# Patient Record
Sex: Female | Born: 1965 | Race: Black or African American | Hispanic: No | Smoking: Never smoker
Health system: Southern US, Community
[De-identification: ages and names within clinical notes are randomized; demographics above are authoritative.]

## PROBLEM LIST (undated history)

## (undated) DIAGNOSIS — F32A Depression, unspecified: Secondary | ICD-10-CM

## (undated) DIAGNOSIS — F329 Major depressive disorder, single episode, unspecified: Secondary | ICD-10-CM

## (undated) DIAGNOSIS — F4310 Post-traumatic stress disorder, unspecified: Secondary | ICD-10-CM

## (undated) DIAGNOSIS — E119 Type 2 diabetes mellitus without complications: Secondary | ICD-10-CM

## (undated) DIAGNOSIS — K219 Gastro-esophageal reflux disease without esophagitis: Secondary | ICD-10-CM

## (undated) DIAGNOSIS — J40 Bronchitis, not specified as acute or chronic: Secondary | ICD-10-CM

## (undated) DIAGNOSIS — H409 Unspecified glaucoma: Secondary | ICD-10-CM

## (undated) DIAGNOSIS — F419 Anxiety disorder, unspecified: Secondary | ICD-10-CM

## (undated) DIAGNOSIS — I10 Essential (primary) hypertension: Secondary | ICD-10-CM

## (undated) DIAGNOSIS — Z9109 Other allergy status, other than to drugs and biological substances: Secondary | ICD-10-CM

## (undated) HISTORY — DX: Type 2 diabetes mellitus without complications: E11.9

## (undated) HISTORY — DX: Anxiety disorder, unspecified: F41.9

## (undated) HISTORY — DX: Major depressive disorder, single episode, unspecified: F32.9

## (undated) HISTORY — DX: Unspecified glaucoma: H40.9

## (undated) HISTORY — PX: ABDOMINAL HYSTERECTOMY: SHX81

## (undated) HISTORY — DX: Bronchitis, not specified as acute or chronic: J40

## (undated) HISTORY — DX: Depression, unspecified: F32.A

## (undated) HISTORY — DX: Gastro-esophageal reflux disease without esophagitis: K21.9

## (undated) HISTORY — DX: Other allergy status, other than to drugs and biological substances: Z91.09

---

## 1998-08-14 ENCOUNTER — Other Ambulatory Visit: Admission: RE | Admit: 1998-08-14 | Discharge: 1998-08-14 | Payer: Self-pay | Admitting: Family Medicine

## 1999-05-03 ENCOUNTER — Emergency Department (HOSPITAL_COMMUNITY): Admission: EM | Admit: 1999-05-03 | Discharge: 1999-05-03 | Payer: Self-pay | Admitting: Emergency Medicine

## 1999-05-04 ENCOUNTER — Encounter: Payer: Self-pay | Admitting: Emergency Medicine

## 1999-11-07 ENCOUNTER — Encounter: Payer: Self-pay | Admitting: Family Medicine

## 1999-11-07 ENCOUNTER — Inpatient Hospital Stay (HOSPITAL_COMMUNITY): Admission: AD | Admit: 1999-11-07 | Discharge: 1999-11-07 | Payer: Self-pay | Admitting: Family Medicine

## 1999-12-27 ENCOUNTER — Emergency Department (HOSPITAL_COMMUNITY): Admission: EM | Admit: 1999-12-27 | Discharge: 1999-12-27 | Payer: Self-pay | Admitting: Emergency Medicine

## 2000-03-26 ENCOUNTER — Encounter: Payer: Self-pay | Admitting: *Deleted

## 2000-03-26 ENCOUNTER — Inpatient Hospital Stay (HOSPITAL_COMMUNITY): Admission: AD | Admit: 2000-03-26 | Discharge: 2000-03-26 | Payer: Self-pay | Admitting: *Deleted

## 2000-08-10 ENCOUNTER — Ambulatory Visit (HOSPITAL_COMMUNITY): Admission: RE | Admit: 2000-08-10 | Discharge: 2000-08-10 | Payer: Self-pay | Admitting: Family Medicine

## 2000-08-10 ENCOUNTER — Encounter: Payer: Self-pay | Admitting: Family Medicine

## 2000-11-09 ENCOUNTER — Other Ambulatory Visit: Admission: RE | Admit: 2000-11-09 | Discharge: 2000-11-09 | Payer: Self-pay | Admitting: Family Medicine

## 2000-11-21 ENCOUNTER — Encounter: Payer: Self-pay | Admitting: Family Medicine

## 2000-11-21 ENCOUNTER — Ambulatory Visit (HOSPITAL_COMMUNITY): Admission: RE | Admit: 2000-11-21 | Discharge: 2000-11-21 | Payer: Self-pay | Admitting: Family Medicine

## 2001-09-12 ENCOUNTER — Ambulatory Visit (HOSPITAL_COMMUNITY): Admission: RE | Admit: 2001-09-12 | Discharge: 2001-09-12 | Payer: Self-pay | Admitting: Family Medicine

## 2003-02-03 ENCOUNTER — Emergency Department (HOSPITAL_COMMUNITY): Admission: EM | Admit: 2003-02-03 | Discharge: 2003-02-03 | Payer: Self-pay | Admitting: Emergency Medicine

## 2003-09-12 ENCOUNTER — Encounter: Admission: RE | Admit: 2003-09-12 | Discharge: 2003-09-12 | Payer: Self-pay | Admitting: Internal Medicine

## 2003-09-13 ENCOUNTER — Encounter: Admission: RE | Admit: 2003-09-13 | Discharge: 2003-09-13 | Payer: Self-pay | Admitting: Internal Medicine

## 2004-09-21 ENCOUNTER — Encounter: Admission: RE | Admit: 2004-09-21 | Discharge: 2004-09-21 | Payer: Self-pay | Admitting: Internal Medicine

## 2004-09-23 ENCOUNTER — Ambulatory Visit (HOSPITAL_COMMUNITY): Admission: RE | Admit: 2004-09-23 | Discharge: 2004-09-23 | Payer: Self-pay | Admitting: Internal Medicine

## 2004-11-18 ENCOUNTER — Ambulatory Visit (HOSPITAL_COMMUNITY): Admission: RE | Admit: 2004-11-18 | Discharge: 2004-11-18 | Payer: Self-pay | Admitting: Obstetrics and Gynecology

## 2004-12-29 ENCOUNTER — Encounter (INDEPENDENT_AMBULATORY_CARE_PROVIDER_SITE_OTHER): Payer: Self-pay | Admitting: *Deleted

## 2004-12-29 ENCOUNTER — Observation Stay (HOSPITAL_COMMUNITY): Admission: RE | Admit: 2004-12-29 | Discharge: 2004-12-30 | Payer: Self-pay | Admitting: Obstetrics and Gynecology

## 2005-01-30 ENCOUNTER — Inpatient Hospital Stay (HOSPITAL_COMMUNITY): Admission: AD | Admit: 2005-01-30 | Discharge: 2005-01-30 | Payer: Self-pay | Admitting: Obstetrics and Gynecology

## 2005-07-02 ENCOUNTER — Emergency Department (HOSPITAL_COMMUNITY): Admission: EM | Admit: 2005-07-02 | Discharge: 2005-07-02 | Payer: Self-pay | Admitting: Family Medicine

## 2005-09-23 ENCOUNTER — Ambulatory Visit (HOSPITAL_COMMUNITY): Admission: RE | Admit: 2005-09-23 | Discharge: 2005-09-23 | Payer: Self-pay | Admitting: Internal Medicine

## 2005-11-18 ENCOUNTER — Emergency Department (HOSPITAL_COMMUNITY): Admission: EM | Admit: 2005-11-18 | Discharge: 2005-11-18 | Payer: Self-pay | Admitting: Emergency Medicine

## 2006-10-04 ENCOUNTER — Ambulatory Visit (HOSPITAL_COMMUNITY): Admission: RE | Admit: 2006-10-04 | Discharge: 2006-10-04 | Payer: Self-pay | Admitting: Internal Medicine

## 2008-02-15 ENCOUNTER — Ambulatory Visit (HOSPITAL_COMMUNITY): Admission: RE | Admit: 2008-02-15 | Discharge: 2008-02-15 | Payer: Self-pay | Admitting: Internal Medicine

## 2008-08-07 ENCOUNTER — Encounter: Admission: RE | Admit: 2008-08-07 | Discharge: 2008-09-12 | Payer: Self-pay | Admitting: Internal Medicine

## 2009-02-21 ENCOUNTER — Ambulatory Visit (HOSPITAL_COMMUNITY): Admission: RE | Admit: 2009-02-21 | Discharge: 2009-02-21 | Payer: Self-pay | Admitting: Internal Medicine

## 2009-02-28 ENCOUNTER — Encounter: Admission: RE | Admit: 2009-02-28 | Discharge: 2009-02-28 | Payer: Self-pay | Admitting: Internal Medicine

## 2010-06-07 ENCOUNTER — Encounter: Payer: Self-pay | Admitting: Internal Medicine

## 2010-06-07 ENCOUNTER — Encounter: Payer: Self-pay | Admitting: Obstetrics and Gynecology

## 2010-10-02 NOTE — H&P (Signed)
NAMENASTASSIA, BAZALDUA NO.:  [id number]   MEDICAL RECORD NO.:  [id number]          PATIENT TYPE:  AMB   LOCATION:  SDC                           FACILITY:  [facility]   PHYSICIAN:  Janine Limbo, M.D.DATE OF BIRTH:  1965/11/22   DATE OF ADMISSION:  12/29/2004  DATE OF DISCHARGE:                                HISTORY & PHYSICAL   HISTORY OF PRESENT ILLNESS:  Ms. Carmen Perkins is a 45 year old female, para 2-1-0-  3, who presents with a fibroid uterus.  She complains of dysmenorrhea and  menorrhagia.  The patient has a past history of pelvic inflammatory disease.  The patient has longstanding pelvic pain and discomfort.  She wants to  proceed at this point with definitive therapy.  Her endometrial biopsy was  within normal limits.  Gonorrhea and Chlamydia tests were normal.  Her most  recent Pap smear was in April 2006, and it was within normal limits.   OBSTETRICAL HISTORY:  1.  The patient has had two term vaginal deliveries.  2.  One preterm vaginal delivery.  3.  She has had a tubal ligation.  4.  The patient was noted to have pelvic adhesions at the time of her tubal      ligation.   PAST MEDICAL HISTORY:  1.  The patient has hypertension, and she currently takes      hydrochlorothiazide and felodipine.  2.  In 2002, the patient was noted to have pain at her umbilical incision      and she had a repair of umbilical scarring.  3.  The patient also has a past history of depression.  4.  Gastroesophageal reflux disease.  5.  She has allergic rhinitis that is well controlled.   DRUG ALLERGIES:  No known drug allergies.   REVIEW OF SYSTEMS:  Please see history of present illness.   FAMILY HISTORY:  The patient's mother and father have hypertension.  She has  one child with asthma.   PHYSICAL EXAMINATION:  VITAL SIGNS:  Weight is 157 pounds, height is 5 feet  4 inches.  HEENT:  Within normal limits.  CHEST:  Clear.  HEART:  Regular rate and rhythm.  BREASTS:   Without masses.  ABDOMEN:  Nontender.  NEUROLOGIC:  Grossly normal.  EXTREMITIES:  Grossly normal.  PELVIC:  External genitalia is normal.  Vagina is normal.  Cervix is  nontender and no lesions are appreciated.  The uterus is eight weeks size  and irregular by exam.  No adnexal masses are appreciated.   ASSESSMENT:  1.  Fibroid uterus.  2.  Menorrhagia.  3.  Dysmenorrhea.  4.  Pelvic pain.  5.  History of pelvic inflammatory disease.   PLAN:  The patient will undergo a laparoscopy-assisted vaginal hysterectomy.  She understands the indications for her surgical procedure, and she accepts  the risk of, but not limited to, anesthetic complications, bleeding,  infections, and possible damage to the surrounding organs.       AVS/MEDQ  D:  12/26/2004  T:  12/26/2004  Job:  [id number]   cc:   Fleet Contras, M.D.  [address]  [place] [address]  Fax: [phone]  Email: [email or web address]

## 2010-10-02 NOTE — Op Note (Signed)
NAMEBELLANIE, Carmen Perkins   MEDICAL RECORD NO.:  [id number]          PATIENT TYPE:  OBV   LOCATION:  9399                          FACILITY:  [facility]   PHYSICIAN:  Carmen Perkins, M.D.DATE OF BIRTH:  12-08-1965   DATE OF PROCEDURE:  12/29/2004  DATE OF DISCHARGE:                                 OPERATIVE REPORT   PREOPERATIVE DIAGNOSES:  1.  Fibroid uterus.  2.  Menorrhagia.  3.  Dysmenorrhea.  4.  Pelvic pain.  5.  History of pelvic inflammatory disease.   POSTOPERATIVE DIAGNOSES:  1.  Menorrhagia.  2.  Dysmenorrhea.  3.  Pelvic pain.  4.  History of pelvic inflammatory disease.  5.  Possible adenomyosis.   PROCEDURE:  Laparoscopy-assisted vaginal hysterectomy.   SURGEON:  Carmen Perkins, M.D.   FIRST ASSISTANT:   ANESTHETIC:  General.   DISPOSITION:  Carmen Perkins is a 45 year old female, para 2-1-0-3, who  presents with the above-mentioned diagnosis.  She understands the  indications for her surgical procedure and she accepts the risk, but not  limited to, anesthetic complications, bleeding, infections, and possible  damage to the surrounding organs.   FINDINGS:  The uterus was approximately eight weeks' size and it had a firm,  boggy fundus, and this was thought to be consistent with adenomyosis.  The  fallopian tubes and the ovaries appeared normal except for defects in the  tubes from the prior tubal ligation.  There was no evidence of adhesions at  this point even though adhesions had been seen previously on laparoscopy.  The bowel and the upper abdomen appeared normal.   PROCEDURE:  The patient was taken to the operating room, where a general  anesthetic was given.  The patient's abdomen, perineum, and vagina were  prepped with multiple layers of Betadine.  A Foley catheter was placed in  the bladder.  Examination under anesthesia was performed.  A Hulka tenaculum  was placed inside the uterus.  The patient was then  sterilely draped.  The  subumbilical area was injected with 0.5% Marcaine with epinephrine.  A  subumbilical incision was made removing the old scar from the prior  laparoscopies.  The incision was extended through the subcutaneous tissue,  the fascia, and the anterior peritoneum.  The Hasson cannula was sutured  into place using 2-0 Vicryl.  The pelvis was carefully inspected after a  pneumoperitoneum was obtained.  We noted that the posterior cul-de-sac was  clear and the adnexa were not adhered to the pelvic sidewall.  We felt that  we could safely proceed with vaginal hysterectomy.  The patient was then  placed in a more lithotomy position.  The cervix was injected with a diluted  solution of Pitressin and saline.  A circumferential incision was made  around the cervix and the vaginal mucosa was advanced anteriorly and  posteriorly.  The posterior cul-de-sac was sharply entered.  Alternating  from right to left, the uterosacral ligaments, paracervical tissues,  parametrial tissues, and uterine arteries were clamped, cut, sutured, and  tied securely.  The uterus was inverted through the posterior  colpotomy.  The upper pedicles were secured using Heaney clamps.  The uterus was  transected from our operative field.  The upper pedicles were then sutured  and then free tied.  Hemostasis was noted to be adequate bilaterally.  The  sutures attached to the uterosacral ligaments were brought out through the  vaginal angles and tied securely.  A McCall culdoplasty suture was placed in  the posterior cul-de-sac incorporating the uterosacral ligaments bilaterally  and the posterior peritoneum.  A final check was made for hemostasis and  hemostasis was noted to be adequate after a figure-of-eight suture was  placed.  All instruments were then removed except for the weighted speculum.  The vaginal cuff was then closed using figure-of-eight sutures incorporating  the anterior vaginal mucosa, the  anterior peritoneum, the posterior  peritoneum, and then the posterior vaginal mucosa.  The suture was tied  securely.  The McCall culdoplasty suture was tied and the apex of the vagina  was noted to elevate into the midpelvis.  The operator then changed gloves  and put on a sterile drape.  A pneumoperitoneum was reestablished.  The  pelvis was carefully inspected and there was no evidence of bleeding.  The  pelvic structures appeared normal for what would be expected after a vaginal  hysterectomy.  All instruments were therefore removed.  The anterior  peritoneum and the fascia were closed using figure-of-eight sutures of 2-0  Vicryl.  The subcutaneous layer was closed using 2-0 Vicryl.  The skin was  reapproximated using a subcuticular suture of 3-0 Monocryl.  Sponge, needle,  and instrument counts were correct on two occasions.  The estimated blood  loss for the procedure was 100 mL.  The patient tolerated her procedure  well.  She was awakened from her anesthetic and taken to the recovery room  in stable condition.      Carmen Perkins, M.D.  Electronically Signed     AVS/MEDQ  D:  12/29/2004  T:  12/29/2004  Job:  [id number]   cc:   Fleet Contras, M.D.  [address]  [place] [address]  Fax: [phone]  Email: [email or web address]

## 2010-10-02 NOTE — Discharge Summary (Signed)
[name], [name] NO.:  [id number]   MEDICAL RECORD NO.:  [id number]          PATIENT TYPE:  OBV   LOCATION:  9316                          [facility]ACILITY:  [facility]   PHYSICIAN:  Janine Limbo, M.D.DATE OF BIRTH:  Aug 21, 1965   DATE OF ADMISSION:  12/29/2004  DATE OF DISCHARGE:  12/30/2004                                 DISCHARGE SUMMARY   ADMISSION DIAGNOSES:  1.  Fibroid uterus.  2.  Menorrhagia.  3.  Dysmenorrhea.  4.  Pelvic pain.  5.  History of pelvic inflammatory disease.   POSTOPERATIVE DIAGNOSES:  1.  Fibroid uterus.  2.  Menorrhagia.  3.  Dysmenorrhea.  4.  Pelvic pain.  5.  History of pelvic inflammatory disease.  6.  Anemia.   PROCEDURES THIS ADMISSION:  December 29, 2004 -  laparoscopy-assisted vaginal  hysterectomy.   HISTORY OF PRESENT ILLNESS:  Carmen Perkins is a 45 year old female, para 2-1-0-  3, who presents with dysmenorrhea and menorrhagia. The patient has a history  of pelvic inflammatory disease. The patient also complains of longstanding  pelvic pain and discomfort. She has decided to proceed with definitive  therapy. Please see her dictated history and physical exam for details.   ADMISSION PHYSICAL EXAMINATION:  The patient's uterus is 8 weeks size.   HOSPITAL COURSE:  On the day of admission the patient was taken to the  operating room where she had a laparoscopy-assisted vaginal hysterectomy.  Operative findings included a uterus that was upper limits normal size. No  adhesions were noted. The fallopian tubes and the ovaries appeared normal.  The patient tolerated her procedure well. Her postoperative hemoglobin was  11.4 (preoperative hemoglobin is 13.2). The patient remained afebrile  throughout her postoperative stay. She quickly tolerated a regular diet and  was discharged to home on postoperative day #1 doing well.   DISCHARGE MEDICATIONS:  1.  Vicodin one or two tablets every 4 hours as needed for pain.  2.  Ibuprofen  600 mg every 6 hours as needed for pain.  3.  Phenergan 25 mg one tablet every 6 hours as needed for nausea.   DISCHARGE INSTRUCTIONS:  The patient will return to see Dr. Stefano Gaul in 6  weeks for follow-up examination. She was given a copy of the postoperative  instruction sheet as prepared by the [facility] division of  [facility]. The patient will refrain from driving for 1  week, heavy lifting for 4 weeks, and intercourse for 6 weeks. She will call  for temperature greater than 100.4 or for other serious complications.   FINAL PATHOLOGY REPORT:  Chronic cervicitis with proliferative endometrium.  Leiomyomata noted within the uterine cavity.      Janine Limbo, M.D.  Electronically Signed     AVS/MEDQ  D:  01/29/2005  T:  01/29/2005  Job:  [id number]

## 2011-07-01 ENCOUNTER — Emergency Department (HOSPITAL_COMMUNITY)
Admission: EM | Admit: 2011-07-01 | Discharge: 2011-07-01 | Disposition: A | Discharge status: Home / Self Care | Payer: No Typology Code available for payment source | Attending: Emergency Medicine | Admitting: Emergency Medicine

## 2011-07-01 ENCOUNTER — Encounter (HOSPITAL_COMMUNITY): Payer: Self-pay | Admitting: Emergency Medicine

## 2011-07-01 ENCOUNTER — Emergency Department (HOSPITAL_COMMUNITY): Payer: No Typology Code available for payment source

## 2011-07-01 DIAGNOSIS — IMO0002 Reserved for concepts with insufficient information to code with codable children: Secondary | ICD-10-CM | POA: Insufficient documentation

## 2011-07-01 DIAGNOSIS — Y92410 Unspecified street and highway as the place of occurrence of the external cause: Secondary | ICD-10-CM | POA: Insufficient documentation

## 2011-07-01 DIAGNOSIS — S43409A Unspecified sprain of unspecified shoulder joint, initial encounter: Secondary | ICD-10-CM

## 2011-07-01 DIAGNOSIS — S39012A Strain of muscle, fascia and tendon of lower back, initial encounter: Secondary | ICD-10-CM

## 2011-07-01 DIAGNOSIS — S335XXA Sprain of ligaments of lumbar spine, initial encounter: Secondary | ICD-10-CM | POA: Insufficient documentation

## 2011-07-01 HISTORY — DX: Post-traumatic stress disorder, unspecified: F43.10

## 2011-07-01 HISTORY — DX: Essential (primary) hypertension: I10

## 2011-07-01 MED ORDER — HYDROCODONE-ACETAMINOPHEN 5-500 MG PO TABS: 1.0000 | ORAL_TABLET | Freq: Four times a day (QID) | ORAL | Status: DC | PRN

## 2011-07-01 MED ORDER — IBUPROFEN 800 MG PO TABS
800.0000 mg | ORAL_TABLET | Freq: Once | ORAL | Status: AC
  Administered 2011-07-01: 800 mg via ORAL
  Filled 2011-07-01: qty 1

## 2011-07-01 MED ORDER — CYCLOBENZAPRINE HCL 10 MG PO TABS: 10.0000 mg | ORAL_TABLET | Freq: Two times a day (BID) | ORAL | Status: AC | PRN
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
## 2011-07-01 NOTE — Discharge Instructions (Signed)
Back Pain, Adult Low back pain is very common. About 1 in 5 people have back pain.The cause of low back pain is rarely dangerous. The pain often gets better over time.About half of people with a sudden onset of back pain feel better in just 2 weeks. About 8 in 10 people feel better by 6 weeks.  CAUSES Some common causes of back pain include:  Strain of the muscles or ligaments supporting the spine.   Wear and tear (degeneration) of the spinal discs.   Arthritis.   Direct injury to the back.  DIAGNOSIS Most of the time, the direct cause of low back pain is not known.However, back pain can be treated effectively even when the exact cause of the pain is unknown.Answering your caregiver's questions about your overall health and symptoms is one of the most accurate ways to make sure the cause of your pain is not dangerous. If your caregiver needs more information, he or she may order lab work or imaging tests (X-rays or MRIs).However, even if imaging tests show changes in your back, this usually does not require surgery. HOME CARE INSTRUCTIONS For many people, back pain returns.Since low back pain is rarely dangerous, it is often a condition that people can learn to manageon their own.   Remain active. It is stressful on the back to sit or stand in one place. Do not sit, drive, or stand in one place for more than 30 minutes at a time. Take short walks on level surfaces as soon as pain allows.Try to increase the length of time you walk each day.   Do not stay in bed.Resting more than 1 or 2 days can delay your recovery.   Do not avoid exercise or work.Your body is made to move.It is not dangerous to be active, even though your back may hurt.Your back will likely heal faster if you return to being active before your pain is gone.   Pay attention to your body when you bend and lift. Many people have less discomfortwhen lifting if they bend their knees, keep the load close to their  bodies,and avoid twisting. Often, the most comfortable positions are those that put less stress on your recovering back.   Find a comfortable position to sleep. Use a firm mattress and lie on your side with your knees slightly bent. If you lie on your back, put a pillow under your knees.   Only take over-the-counter or prescription medicines as directed by your caregiver. Over-the-counter medicines to reduce pain and inflammation are often the most helpful.Your caregiver may prescribe muscle relaxant drugs.These medicines help dull your pain so you can more quickly return to your normal activities and healthy exercise.   Put ice on the injured area.   Put ice in a plastic bag.   Place a towel between your skin and the bag.   Leave the ice on for 15 to 20 minutes, 3 to 4 times a day for the first 2 to 3 days. After that, ice and heat may be alternated to reduce pain and spasms.   Ask your caregiver about trying back exercises and gentle massage. This may be of some benefit.   Avoid feeling anxious or stressed.Stress increases muscle tension and can worsen back pain.It is important to recognize when you are anxious or stressed and learn ways to manage it.Exercise is a great option.  SEEK MEDICAL CARE IF:  You have pain that is not relieved with rest or medicine.   You have   pain that does not improve in 1 week.   You have new symptoms.   You are generally not feeling well.  SEEK IMMEDIATE MEDICAL CARE IF:   You have pain that radiates from your back into your legs.   You develop new bowel or bladder control problems.   You have unusual weakness or numbness in your arms or legs.   You develop nausea or vomiting.   You develop abdominal pain.   You feel faint.  Document Released: 05/03/2005 Document Revised: 01/13/2011 Document Reviewed: 09/21/2010 [employer] Patient Information 2012 [employer]. 

## 2011-07-03 ENCOUNTER — Emergency Department (HOSPITAL_COMMUNITY)
Admission: EM | Admit: 2011-07-03 | Discharge: 2011-07-03 | Disposition: A | Discharge status: Home / Self Care | Payer: No Typology Code available for payment source | Attending: Emergency Medicine | Admitting: Emergency Medicine

## 2011-07-03 ENCOUNTER — Encounter (HOSPITAL_COMMUNITY): Payer: Self-pay | Admitting: *Deleted

## 2011-07-03 ENCOUNTER — Emergency Department (HOSPITAL_COMMUNITY): Payer: No Typology Code available for payment source

## 2011-07-03 DIAGNOSIS — M25519 Pain in unspecified shoulder: Secondary | ICD-10-CM | POA: Insufficient documentation

## 2011-07-03 DIAGNOSIS — Z79899 Other long term (current) drug therapy: Secondary | ICD-10-CM | POA: Insufficient documentation

## 2011-07-03 DIAGNOSIS — M549 Dorsalgia, unspecified: Secondary | ICD-10-CM | POA: Insufficient documentation

## 2011-07-03 DIAGNOSIS — M79609 Pain in unspecified limb: Secondary | ICD-10-CM | POA: Insufficient documentation

## 2011-07-03 DIAGNOSIS — M542 Cervicalgia: Secondary | ICD-10-CM | POA: Insufficient documentation

## 2011-07-03 DIAGNOSIS — F0781 Postconcussional syndrome: Secondary | ICD-10-CM

## 2011-07-03 DIAGNOSIS — R51 Headache: Secondary | ICD-10-CM | POA: Insufficient documentation

## 2011-07-03 DIAGNOSIS — I10 Essential (primary) hypertension: Secondary | ICD-10-CM | POA: Insufficient documentation

## 2011-07-03 DIAGNOSIS — IMO0001 Reserved for inherently not codable concepts without codable children: Secondary | ICD-10-CM | POA: Insufficient documentation
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
## 2011-07-03 NOTE — Discharge Instructions (Signed)
Be sure to read and understand instructions below prior to leaving the hospital. If your symptoms persist without any improvement in 1 week it is reccommended that you follow up with orthopedics listed above.  Common mechanisms of injury include:   Direct hit (trauma) to the shoulder.  Aging, erosion of the tendon with normal use.  Bony bump on shoulder (acromial spur).  Stress from sudden increase in duration, frequency, or intensity of training  RISK INCREASES WITH:  Contact sports (football, wrestling, boxing).  Throwing sports (baseball, tennis, volleyball).  Weightlifting and bodybuilding.  Heavy labor.  Previous injury to the rotator cuff, including impingement.  Poor shoulder strength and flexibility.  Failure to warm up properly before activity.  Inadequate protective equipment.  Old age.  Bony bump on shoulder (acromial spur).   RELATED COMPLICATIONS  Shoulder stiffness, frozen shoulder, or loss of motion.  Rotator cuff tendon tear.  Recurring symptoms, especially if activity is resumed too soon, with overuse, with a direct blow, or when using poor technique.   TREATMENT  Treatment first involves the use of ice and medicine, to reduce pain and inflammation. The use of strengthening and stretching exercises may help reduce pain with activity. These exercises may be performed at home or with a therapist.  If non-surgical treatment is unsuccessful after more than 6 months, surgery may be advised. MEDICATION  If pain medicine is needed, nonsteroidal anti-inflammatory medicines (Motrin and ibuprofen), or other minor pain relievers (acetaminophen) Prescription pain relievers may be given, if your caregiver thinks they are needed. Use only as directed and only as much as you need  ACTIVITY       - It is reccommended to perform range of motion activity as tolerated to prevent shoulder stiffness.  HEAT AND COLD  Cold treatment (icing) should be applied for 10 to 15 minutes every 2 to  3 hours for inflammation and pain, and immediately after activity that aggravates your symptoms. Use ice packs or an ice massage.  Heat treatment may be used before performing stretching and strengthening activities prescribed by your caregiver, physical therapist, or athletic trainer. Use a heat pack or a warm water soak.  SEEK IMMEDIATE MEDICAL CARE IF:  Your arm, hand, or fingers are numb or tingling.  Your arm, hand, or fingers are swollen, painful, or turn white or blue.  You develop chest pain or shortness of breath.   Post-Concussion Syndrome  Post-concussion syndrome describes the symptoms that can occur after a head injury. These symptoms can last from weeks to months.  CAUSES  It is not clear why some head injuries cause post-concussion syndrome. It can occur whether your head injury was mild or severe and whether you were wearing head protection or not.  SYMPTOMS  Memory difficulties.  Dizziness.  Headaches.  Double vision or blurry vision.  Sensitivity to light.  Hearing difficulties.  Depression.  Tiredness.  Weakness.  Difficulty with concentration.  Difficulty sleeping or staying asleep.  Vomiting.  DIAGNOSIS  There is no test to determine whether you have post-concussion syndrome. Your caregiver may order an imaging scan of your brain, such as a CT scan, to check for other problems that may be causing your symptoms (such as severe injury inside your skull).  TREATMENT  Usually, these problems disappear over time without medical care. Your caregiver may prescribe medicine to help ease your symptoms. It is important to follow up with a neurologist to evaluate your recovery and address any lingering symptoms or issues.  HOME CARE  INSTRUCTIONS  Only take over-the-counter or prescription medicines for pain, discomfort, or fever as directed by your caregiver. Do not take aspirin. Aspirin can slow blood clotting.  Sleep with your head slightly elevated to help with headaches.    Avoid any situation where there is potential for another head injury (football, hockey, martial arts, horseback riding). Your condition will get worse every time you experience a concussion. You should avoid these activities until you are evaluated by the appropriate follow-up caregivers.  Keep all follow-up appointments as directed by your caregiver.  SEEK IMMEDIATE MEDICAL CARE IF:  You develop confusion or unusual drowsiness.  You cannot wake the injured person.  You develop nausea or persistent, forceful vomiting.  You feel like you are moving when you are not (vertigo).  You notice the injured person's eyes moving rapidly back and forth. This may be a sign of vertigo.  You have convulsions or faint.  You have severe, persistent headaches that are not relieved by medicine.  You cannot use your arms or legs normally.  Your pupils change size.  You have clear or bloody discharge from the nose or ears.  Your problems are getting worse, not better.

## 2011-07-04 NOTE — ED Provider Notes (Signed)
Medical screening examination/treatment/procedure(s) were performed by non-physician practitioner and as supervising physician I was immediately available for consultation/collaboration.  Nicholes Stairs, MD 07/04/11 0010

## 2011-11-10 ENCOUNTER — Other Ambulatory Visit: Payer: Self-pay | Admitting: Internal Medicine

## 2011-11-10 DIAGNOSIS — N63 Unspecified lump in unspecified breast: Secondary | ICD-10-CM

## 2011-11-15 ENCOUNTER — Other Ambulatory Visit: Payer: No Typology Code available for payment source

## 2013-05-09 ENCOUNTER — Other Ambulatory Visit: Payer: Self-pay | Admitting: Emergency Medicine

## 2014-01-09 ENCOUNTER — Encounter: Payer: Self-pay | Admitting: Gastroenterology

## 2014-03-05 ENCOUNTER — Encounter: Payer: Self-pay | Admitting: Gastroenterology

## 2014-07-09 DIAGNOSIS — I10 Essential (primary) hypertension: Secondary | ICD-10-CM | POA: Diagnosis not present

## 2014-07-09 DIAGNOSIS — R7301 Impaired fasting glucose: Secondary | ICD-10-CM | POA: Diagnosis not present

## 2014-07-09 DIAGNOSIS — J302 Other seasonal allergic rhinitis: Secondary | ICD-10-CM | POA: Diagnosis not present

## 2014-07-09 DIAGNOSIS — J449 Chronic obstructive pulmonary disease, unspecified: Secondary | ICD-10-CM | POA: Diagnosis not present

## 2014-07-09 DIAGNOSIS — F4312 Post-traumatic stress disorder, chronic: Secondary | ICD-10-CM | POA: Diagnosis not present

## 2014-07-16 DIAGNOSIS — R7301 Impaired fasting glucose: Secondary | ICD-10-CM | POA: Diagnosis not present

## 2014-07-16 DIAGNOSIS — I10 Essential (primary) hypertension: Secondary | ICD-10-CM | POA: Diagnosis not present

## 2014-07-16 DIAGNOSIS — J302 Other seasonal allergic rhinitis: Secondary | ICD-10-CM | POA: Diagnosis not present

## 2014-07-16 DIAGNOSIS — J019 Acute sinusitis, unspecified: Secondary | ICD-10-CM | POA: Diagnosis not present

## 2014-08-13 DIAGNOSIS — Z6832 Body mass index (BMI) 32.0-32.9, adult: Secondary | ICD-10-CM | POA: Diagnosis not present

## 2014-08-13 DIAGNOSIS — Z13220 Encounter for screening for lipoid disorders: Secondary | ICD-10-CM | POA: Diagnosis not present

## 2014-08-13 DIAGNOSIS — I10 Essential (primary) hypertension: Secondary | ICD-10-CM | POA: Diagnosis not present

## 2014-08-13 DIAGNOSIS — R7301 Impaired fasting glucose: Secondary | ICD-10-CM | POA: Diagnosis not present

## 2014-08-13 DIAGNOSIS — E669 Obesity, unspecified: Secondary | ICD-10-CM | POA: Diagnosis not present

## 2014-10-08 DIAGNOSIS — F4312 Post-traumatic stress disorder, chronic: Secondary | ICD-10-CM | POA: Diagnosis not present

## 2014-10-08 DIAGNOSIS — R7301 Impaired fasting glucose: Secondary | ICD-10-CM | POA: Diagnosis not present

## 2014-10-08 DIAGNOSIS — Z1239 Encounter for other screening for malignant neoplasm of breast: Secondary | ICD-10-CM | POA: Diagnosis not present

## 2014-10-08 DIAGNOSIS — J449 Chronic obstructive pulmonary disease, unspecified: Secondary | ICD-10-CM | POA: Diagnosis not present

## 2014-10-08 DIAGNOSIS — I10 Essential (primary) hypertension: Secondary | ICD-10-CM | POA: Diagnosis not present

## 2015-01-08 DIAGNOSIS — R7301 Impaired fasting glucose: Secondary | ICD-10-CM | POA: Diagnosis not present

## 2015-01-08 DIAGNOSIS — J449 Chronic obstructive pulmonary disease, unspecified: Secondary | ICD-10-CM | POA: Diagnosis not present

## 2015-01-08 DIAGNOSIS — N760 Acute vaginitis: Secondary | ICD-10-CM | POA: Diagnosis not present

## 2015-01-08 DIAGNOSIS — Z124 Encounter for screening for malignant neoplasm of cervix: Secondary | ICD-10-CM | POA: Diagnosis not present

## 2015-01-08 DIAGNOSIS — I10 Essential (primary) hypertension: Secondary | ICD-10-CM | POA: Diagnosis not present

## 2015-01-23 DIAGNOSIS — J019 Acute sinusitis, unspecified: Secondary | ICD-10-CM | POA: Diagnosis not present

## 2015-01-23 DIAGNOSIS — J209 Acute bronchitis, unspecified: Secondary | ICD-10-CM | POA: Diagnosis not present

## 2015-01-23 DIAGNOSIS — R7301 Impaired fasting glucose: Secondary | ICD-10-CM | POA: Diagnosis not present

## 2015-01-23 DIAGNOSIS — I10 Essential (primary) hypertension: Secondary | ICD-10-CM | POA: Diagnosis not present

## 2015-03-13 DIAGNOSIS — J302 Other seasonal allergic rhinitis: Secondary | ICD-10-CM | POA: Diagnosis not present

## 2015-03-13 DIAGNOSIS — Z23 Encounter for immunization: Secondary | ICD-10-CM | POA: Diagnosis not present

## 2015-03-13 DIAGNOSIS — I10 Essential (primary) hypertension: Secondary | ICD-10-CM | POA: Diagnosis not present

## 2015-03-13 DIAGNOSIS — J449 Chronic obstructive pulmonary disease, unspecified: Secondary | ICD-10-CM | POA: Diagnosis not present

## 2015-03-13 DIAGNOSIS — R7301 Impaired fasting glucose: Secondary | ICD-10-CM | POA: Diagnosis not present

## 2015-04-15 DIAGNOSIS — R7301 Impaired fasting glucose: Secondary | ICD-10-CM | POA: Diagnosis not present

## 2015-04-15 DIAGNOSIS — J019 Acute sinusitis, unspecified: Secondary | ICD-10-CM | POA: Diagnosis not present

## 2015-04-15 DIAGNOSIS — I10 Essential (primary) hypertension: Secondary | ICD-10-CM | POA: Diagnosis not present

## 2015-04-15 DIAGNOSIS — J449 Chronic obstructive pulmonary disease, unspecified: Secondary | ICD-10-CM | POA: Diagnosis not present

## 2015-04-15 DIAGNOSIS — J060 Acute laryngopharyngitis: Secondary | ICD-10-CM | POA: Diagnosis not present

## 2015-05-10 ENCOUNTER — Emergency Department (HOSPITAL_COMMUNITY): Payer: Medicare Other

## 2015-05-10 ENCOUNTER — Emergency Department (HOSPITAL_COMMUNITY)
Admission: EM | Admit: 2015-05-10 | Discharge: 2015-05-10 | Disposition: A | Discharge status: Home / Self Care | Payer: Medicare Other | Attending: Emergency Medicine | Admitting: Emergency Medicine

## 2015-05-10 ENCOUNTER — Encounter (HOSPITAL_COMMUNITY): Payer: Self-pay | Admitting: Emergency Medicine

## 2015-05-10 DIAGNOSIS — R079 Chest pain, unspecified: Secondary | ICD-10-CM | POA: Diagnosis not present

## 2015-05-10 DIAGNOSIS — R51 Headache: Secondary | ICD-10-CM | POA: Insufficient documentation

## 2015-05-10 DIAGNOSIS — F4310 Post-traumatic stress disorder, unspecified: Secondary | ICD-10-CM | POA: Diagnosis not present

## 2015-05-10 DIAGNOSIS — I10 Essential (primary) hypertension: Secondary | ICD-10-CM | POA: Insufficient documentation

## 2015-05-10 DIAGNOSIS — Z7984 Long term (current) use of oral hypoglycemic drugs: Secondary | ICD-10-CM | POA: Diagnosis not present

## 2015-05-10 DIAGNOSIS — Z79899 Other long term (current) drug therapy: Secondary | ICD-10-CM | POA: Diagnosis not present

## 2015-05-10 DIAGNOSIS — R0789 Other chest pain: Secondary | ICD-10-CM | POA: Insufficient documentation

## 2015-05-10 DIAGNOSIS — R61 Generalized hyperhidrosis: Secondary | ICD-10-CM | POA: Diagnosis not present

## 2015-05-10 DIAGNOSIS — R072 Precordial pain: Secondary | ICD-10-CM | POA: Diagnosis not present

## 2015-05-10 DIAGNOSIS — R519 Headache, unspecified: Secondary | ICD-10-CM

## 2015-05-10 DIAGNOSIS — R002 Palpitations: Secondary | ICD-10-CM | POA: Diagnosis not present

## 2015-05-10 LAB — BASIC METABOLIC PANEL
ANION GAP: 9 (ref 5–15)
BUN: 16 mg/dL (ref 6–20)
CALCIUM: 10.1 mg/dL (ref 8.9–10.3)
CO2: 23 mmol/L (ref 22–32)
Chloride: 105 mmol/L (ref 101–111)
Creatinine, Ser: 0.61 mg/dL (ref 0.44–1.00)
GFR calc non Af Amer: >60 mL/min (ref >60)
Glucose, Bld: 136 mg/dL — ABNORMAL HIGH (ref 65–99)
POTASSIUM: 3.6 mmol/L (ref 3.5–5.1)
Sodium: 137 mmol/L (ref 135–145)

## 2015-05-10 LAB — CBC
HEMATOCRIT: 41.1 % (ref 36.0–46.0)
HEMOGLOBIN: 14.1 g/dL (ref 12.0–15.0)
MCH: 30.3 pg (ref 26.0–34.0)
MCHC: 34.3 g/dL (ref 30.0–36.0)
MCV: 88.2 fL (ref 78.0–100.0)
Platelets: 228 10*3/uL (ref 150–400)
RBC: 4.66 MIL/uL (ref 3.87–5.11)
RDW: 13.2 % (ref 11.5–15.5)
WBC: 5.7 10*3/uL (ref 4.0–10.5)

## 2015-05-10 LAB — I-STAT TROPONIN, ED: TROPONIN I, POC: 0 ng/mL (ref 0.00–0.08)

## 2015-05-10 MED ORDER — SODIUM CHLORIDE 0.9 % IV BOLUS (SEPSIS)
1000.0000 mL | Freq: Once | INTRAVENOUS | Status: AC
  Administered 2015-05-10: 1000 mL via INTRAVENOUS

## 2015-05-10 MED ORDER — PROCHLORPERAZINE EDISYLATE 5 MG/ML IJ SOLN
10.0000 mg | Freq: Four times a day (QID) | INTRAMUSCULAR | Status: DC | PRN
  Administered 2015-05-10: 10 mg via INTRAVENOUS
  Filled 2015-05-10: qty 2

## 2015-05-10 MED ORDER — DEXAMETHASONE SODIUM PHOSPHATE 10 MG/ML IJ SOLN
10.0000 mg | Freq: Once | INTRAMUSCULAR | Status: AC
  Administered 2015-05-10: 10 mg via INTRAVENOUS
  Filled 2015-05-10: qty 1

## 2015-05-10 MED ORDER — DIPHENHYDRAMINE HCL 50 MG/ML IJ SOLN
25.0000 mg | Freq: Once | INTRAMUSCULAR | Status: AC
  Administered 2015-05-10: 25 mg via INTRAVENOUS
  Filled 2015-05-10: qty 1

## 2015-05-10 NOTE — ED Notes (Signed)
Pt with sandwich at bedside

## 2015-05-10 NOTE — ED Notes (Addendum)
Pt c/o center chest pain with shortness of breath and nausea x 3 days. Pt took 2 aleve's PTA.

## 2015-05-10 NOTE — Discharge Instructions (Signed)
Migraine Headache A migraine headache is an intense, throbbing pain on one or both sides of your head. A migraine can last for 30 minutes to several hours. CAUSES  The exact cause of a migraine headache is not always known. However, a migraine may be caused when nerves in the brain become irritated and release chemicals that cause inflammation. This causes pain. Certain things may also trigger migraines, such as:  Alcohol.  Smoking.  Stress.  Menstruation.  Aged cheeses.  Foods or drinks that contain nitrates, glutamate, aspartame, or tyramine.  Lack of sleep.  Chocolate.  Caffeine.  Hunger.  Physical exertion.  Fatigue.  Medicines used to treat chest pain (nitroglycerine), birth control pills, estrogen, and some blood pressure medicines. SIGNS AND SYMPTOMS  Pain on one or both sides of your head.  Pulsating or throbbing pain.  Severe pain that prevents daily activities.  Pain that is aggravated by any physical activity.  Nausea, vomiting, or both.  Dizziness.  Pain with exposure to bright lights, loud noises, or activity.  General sensitivity to bright lights, loud noises, or smells. Before you get a migraine, you may get warning signs that a migraine is coming (aura). An aura may include:  Seeing flashing lights.  Seeing bright spots, halos, or zigzag lines.  Having tunnel vision or blurred vision.  Having feelings of numbness or tingling.  Having trouble talking.  Having muscle weakness. DIAGNOSIS  A migraine headache is often diagnosed based on:  Symptoms.  Physical exam.  A CT scan or MRI of your head. These imaging tests cannot diagnose migraines, but they can help rule out other causes of headaches. TREATMENT Medicines may be given for pain and nausea. Medicines can also be given to help prevent recurrent migraines.  HOME CARE INSTRUCTIONS  Only take over-the-counter or prescription medicines for pain or discomfort as directed by your  health care provider. The use of long-term narcotics is not recommended.  Lie down in a dark, quiet room when you have a migraine.  Keep a journal to find out what may trigger your migraine headaches. For example, write down:  What you eat and drink.  How much sleep you get.  Any change to your diet or medicines.  Limit alcohol consumption.  Quit smoking if you smoke.  Get 7-9 hours of sleep, or as recommended by your health care provider.  Limit stress.  Keep lights dim if bright lights bother you and make your migraines worse. SEEK IMMEDIATE MEDICAL CARE IF:   Your migraine becomes severe.  You have a fever.  You have a stiff neck.  You have vision loss.  You have muscular weakness or loss of muscle control.  You start losing your balance or have trouble walking.  You feel faint or pass out.  You have severe symptoms that are different from your first symptoms. MAKE SURE YOU:   Understand these instructions.  Will watch your condition.  Will get help right away if you are not doing well or get worse.   This information is not intended to replace advice given to you by your health care provider. Make sure you discuss any questions you have with your health care provider.   Document Released: 05/03/2005 Document Revised: 05/24/2014 Document Reviewed: 01/08/2013 Elsevier Interactive Patient Education 2016 [employer]   Nonspecific Chest Pain  Chest pain can be caused by many different conditions. There is always a chance that your pain could be related to something serious, such as a heart attack or  a blood clot in your lungs. Chest pain can also be caused by conditions that are not life-threatening. If you have chest pain, it is very important to follow up with your health care provider. CAUSES  Chest pain can be caused by:  Heartburn.  Pneumonia or bronchitis.  Anxiety or stress.  Inflammation around your heart (pericarditis) or lung (pleuritis or  pleurisy).  A blood clot in your lung.  A collapsed lung (pneumothorax). It can develop suddenly on its own (spontaneous pneumothorax) or from trauma to the chest.  Shingles infection (varicella-zoster virus).  Heart attack.  Damage to the bones, muscles, and cartilage that make up your chest wall. This can include:  Bruised bones due to injury.  Strained muscles or cartilage due to frequent or repeated coughing or overwork.  Fracture to one or more ribs.  Sore cartilage due to inflammation (costochondritis). RISK FACTORS  Risk factors for chest pain may include:  Activities that increase your risk for trauma or injury to your chest.  Respiratory infections or conditions that cause frequent coughing.  Medical conditions or overeating that can cause heartburn.  Heart disease or family history of heart disease.  Conditions or health behaviors that increase your risk of developing a blood clot.  Having had chicken pox (varicella zoster). SIGNS AND SYMPTOMS Chest pain can feel like:  Burning or tingling on the surface of your chest or deep in your chest.  Crushing, pressure, aching, or squeezing pain.  Dull or sharp pain that is worse when you move, cough, or take a deep breath.  Pain that is also felt in your back, neck, shoulder, or arm, or pain that spreads to any of these areas. Your chest pain may come and go, or it may stay constant. DIAGNOSIS Lab tests or other studies may be needed to find the cause of your pain. Your health care provider may have you take a test called an ambulatory ECG (electrocardiogram). An ECG records your heartbeat patterns at the time the test is performed. You may also have other tests, such as:  Transthoracic echocardiogram (TTE). During echocardiography, sound waves are used to create a picture of all of the heart structures and to look at how blood flows through your heart.  Transesophageal echocardiogram (TEE).This is a more advanced  imaging test that obtains images from inside your body. It allows your health care provider to see your heart in finer detail.  Cardiac monitoring. This allows your health care provider to monitor your heart rate and rhythm in real time.  Holter monitor. This is a portable device that records your heartbeat and can help to diagnose abnormal heartbeats. It allows your health care provider to track your heart activity for several days, if needed.  Stress tests. These can be done through exercise or by taking medicine that makes your heart beat more quickly.  Blood tests.  Imaging tests. TREATMENT  Your treatment depends on what is causing your chest pain. Treatment may include:  Medicines. These may include:  Acid blockers for heartburn.  Anti-inflammatory medicine.  Pain medicine for inflammatory conditions.  Antibiotic medicine, if an infection is present.  Medicines to dissolve blood clots.  Medicines to treat coronary artery disease.  Supportive care for conditions that do not require medicines. This may include:  Resting.  Applying heat or cold packs to injured areas.  Limiting activities until pain decreases. HOME CARE INSTRUCTIONS  If you were prescribed an antibiotic medicine, finish it all even if you start to feel  better.  Avoid any activities that bring on chest pain.  Do not use any tobacco products, including cigarettes, chewing tobacco, or electronic cigarettes. If you need help quitting, ask your health care provider.  Do not drink alcohol.  Take medicines only as directed by your health care provider.  Keep all follow-up visits as directed by your health care provider. This is important. This includes any further testing if your chest pain does not go away.  If heartburn is the cause for your chest pain, you may be told to keep your head raised (elevated) while sleeping. This reduces the chance that acid will go from your stomach into your  esophagus.  Make lifestyle changes as directed by your health care provider. These may include:  Getting regular exercise. Ask your health care provider to suggest some activities that are safe for you.  Eating a heart-healthy diet. A registered dietitian can help you to learn healthy eating options.  Maintaining a healthy weight.  Managing diabetes, if necessary.  Reducing stress. SEEK MEDICAL CARE IF:  Your chest pain does not go away after treatment.  You have a rash with blisters on your chest.  You have a fever. SEEK IMMEDIATE MEDICAL CARE IF:   Your chest pain is worse.  You have an increasing cough, or you cough up blood.  You have severe abdominal pain.  You have severe weakness.  You faint.  You have chills.  You have sudden, unexplained chest discomfort.  You have sudden, unexplained discomfort in your arms, back, neck, or jaw.  You have shortness of breath at any time.  You suddenly start to sweat, or your skin gets clammy.  You feel nauseous or you vomit.  You suddenly feel light-headed or dizzy.  Your heart begins to beat quickly, or it feels like it is skipping beats. These symptoms may represent a serious problem that is an emergency. Do not wait to see if the symptoms will go away. Get medical help right away. Call your local emergency services (911 in the [place] Do not drive yourself to the hospital.   This information is not intended to replace advice given to you by your health care provider. Make sure you discuss any questions you have with your health care provider.   Document Released: 02/10/2005 Document Revised: 05/24/2014 Document Reviewed: 12/07/2013 Elsevier Interactive Patient Education [employer].

## 2015-05-10 NOTE — ED Provider Notes (Signed)
CSN: [id number]     Arrival date & time 05/10/15  1207 History   First MD Initiated Contact with Patient 05/10/15 1621     Chief Complaint  Patient presents with  . Chest Pain   49 year old activity female past medical history of HTN who presents today for headache and chest pain. Patient says that her chest pain is substernal, sharp in nature, nonradiating, present intermittently for 2 days. Not worse with exertion. Accompanied by slight shortness of breath and diaphoresis. No nausea or vomiting. Also has had a headache constantly for 3 days, where she's ever had. Came on suddenly, 10 out of 10. Makes her slightly dizzy. Worse with lying flat. Only mild improvement with over-the-counter pain medicines. Denies any fevers, chills, neck pain, abdominal pain, back pain, hematuria, dysuria, sick contacts, recent travel, numbness and tingling or weakness to the extremity.  (Consider location/radiation/quality/duration/timing/severity/associated sxs/prior Treatment) Patient is a 49 y.o. female presenting with chest pain.  Chest Pain Pain location:  Substernal area Pain quality: sharp   Pain radiates to:  Does not radiate Pain radiates to the back: no   Pain severity:  Moderate Onset quality:  Sudden Duration:  2 days Timing:  Intermittent Progression:  Unchanged Chronicity:  New Context: breathing and at rest   Relieved by:  Nothing Worsened by:  Nothing tried Ineffective treatments:  None tried Associated symptoms: diaphoresis, headache (frontal), palpitations and shortness of breath   Associated symptoms: no abdominal pain, no dizziness, no fever, no nausea, not vomiting and no weakness   Headaches:    Severity:  Severe   Onset quality:  Sudden   Duration:  3 days   Timing:  Constant   Progression:  Partially resolved   Chronicity:  New Risk factors: hypertension     Past Medical History  Diagnosis Date  . Hypertension   . PTSD (post-traumatic stress disorder)    Past Surgical  History  Procedure Laterality Date  . Abdominal hysterectomy     No family history on file. Social History  Substance Use Topics  . Smoking status: Never Smoker   . Smokeless tobacco: None  . Alcohol Use: No   OB History    No data available     Review of Systems  Constitutional: Positive for diaphoresis. Negative for fever and chills.  Respiratory: Positive for shortness of breath.   Cardiovascular: Positive for chest pain and palpitations. Negative for leg swelling.  Gastrointestinal: Negative for nausea, vomiting, abdominal pain, diarrhea, constipation and abdominal distention.  Genitourinary: Negative for dysuria, frequency, flank pain and decreased urine volume.  Neurological: Positive for headaches (frontal). Negative for dizziness, speech difficulty, weakness and light-headedness.  All other systems reviewed and are negative.     Allergies  Review of patient's allergies indicates no known allergies.  Home Medications   Prior to Admission medications   Medication Sig Start Date End Date Taking? Authorizing Provider  albuterol (PROVENTIL HFA;VENTOLIN HFA) 108 (90 BASE) MCG/ACT inhaler Inhale 2 puffs into the lungs every 4 (four) hours as needed. For wheezing or shortness of breath.   Yes Historical Provider, MD  amLODipine (NORVASC) 10 MG tablet Take 10 mg by mouth daily.   Yes Historical Provider, MD  Calcium Carbonate-Vitamin D (CALCIUM 600 + D PO) Take 1 tablet by mouth 2 (two) times daily.   Yes Historical Provider, MD  cetirizine (ZYRTEC) 10 MG tablet Take 10 mg by mouth daily.   Yes Historical Provider, MD  hydrochlorothiazide (HYDRODIURIL) 25 MG tablet Take 25 mg by  mouth daily.   Yes Historical Provider, MD  ibuprofen (ADVIL,MOTRIN) 800 MG tablet Take 800 mg by mouth every 8 (eight) hours as needed. For back pain.   Yes Historical Provider, MD  losartan (COZAAR) 100 MG tablet Take 100 mg by mouth daily. 02/28/15  Yes Historical Provider, MD  metFORMIN  (GLUCOPHAGE) 500 MG tablet Take 250 mg by mouth daily with breakfast.   Yes Historical Provider, MD  metoprolol tartrate (LOPRESSOR) 25 MG tablet Take 25 mg by mouth 2 (two) times daily.   Yes Historical Provider, MD  Multiple Vitamin (MULTIVITAMIN WITH MINERALS) TABS tablet Take 1 tablet by mouth daily.   Yes Historical Provider, MD  PARoxetine (PAXIL) 40 MG tablet Take 60 mg by mouth daily.   Yes Historical Provider, MD  polyvinyl alcohol (LIQUIFILM TEARS) 1.4 % ophthalmic solution Place 1 drop into both eyes as needed. For dry eyes.   Yes Historical Provider, MD  potassium chloride SA (K-DUR,KLOR-CON) 20 MEQ tablet Take 20 mEq by mouth 2 (two) times daily.   Yes Historical Provider, MD  sennosides-docusate sodium (SENOKOT-S) 8.6-50 MG tablet Take 2 tablets by mouth 2 (two) times daily.   Yes Historical Provider, MD  vitamin C (ASCORBIC ACID) 250 MG tablet Take 500 mg by mouth daily.   Yes Historical Provider, MD   BP 143/104 mmHg  Pulse 78  Temp(Src) 98.6 F (37 C)  Resp 20  Ht 5\' 4"  (1.626 m)  Wt 82.101 kg  BMI 31.05 kg/m2  SpO2 99% Physical Exam  Constitutional: She is oriented to person, place, and time. She appears well-developed and well-nourished. No distress.  HENT:  Head: Normocephalic and atraumatic.  Eyes: Pupils are equal, round, and reactive to light.  Cardiovascular: Normal rate, regular rhythm, normal heart sounds and intact distal pulses.  Exam reveals no gallop and no friction rub.   No murmur heard. Pulmonary/Chest: Effort normal and breath sounds normal. No respiratory distress. She has no wheezes. She has no rales. She exhibits no tenderness.  Abdominal: Soft. Bowel sounds are normal. She exhibits no distension and no mass. There is no tenderness. There is no rebound and no guarding.  Musculoskeletal: Normal range of motion. She exhibits no edema or tenderness.  Lymphadenopathy:    She has no cervical adenopathy.  Neurological: She is alert and oriented to person,  place, and time. No cranial nerve deficit. Coordination normal.  Skin: Skin is warm and dry. No rash noted. She is not diaphoretic.  Nursing note and vitals reviewed.   ED Course  Procedures (including critical care time) Labs Review Labs Reviewed  BASIC METABOLIC PANEL - Abnormal; Notable for the following:    Glucose, Bld 136 (*)    All other components within normal limits  CBC  I-STAT TROPOININ, ED    Imaging Review Dg Chest 2 View  05/10/2015  CLINICAL DATA:  Midsternal chest pain for 4 days. Initial encounter. EXAM: CHEST  2 VIEW COMPARISON:  PA and lateral chest 11/18/2005. FINDINGS: The lungs are clear. Heart size is normal. No pneumothorax or pleural effusion. No bony abnormality IMPRESSION: Normal chest. Electronically Signed   By: Inge Rise M.D.   On: 05/10/2015 12:46   Ct Head Wo Contrast  05/10/2015  CLINICAL DATA:  Headache with nausea and left shoulder pain. Initial encounter. EXAM: CT HEAD WITHOUT CONTRAST TECHNIQUE: Contiguous axial images were obtained from the base of the skull through the vertex without intravenous contrast. COMPARISON:  None. FINDINGS: There is no evidence of acute intracranial hemorrhage,  mass lesion, brain edema or extra-axial fluid collection. The ventricles and subarachnoid spaces are appropriately sized for age. There is no CT evidence of acute cortical infarction. There is minimal mucosal thickening or mucous retention cyst formation in the ethmoid sinuses. The visualized paranasal sinuses, mastoid air cells and middle ears are otherwise clear. Sclerotic lesion in the left skull base on image number 9 has a nonaggressive appearance. No acute osseous findings. IMPRESSION: No acute intracranial findings. No explanation for the patient's symptoms. Electronically Signed   By: Richardean Sale M.D.   On: 05/10/2015 19:39   I have personally reviewed and evaluated these images and lab results as part of my medical decision-making.   EKG  Interpretation   Date/Time:  Saturday May 10 2015 12:15:32 EST Ventricular Rate:  82 PR Interval:  184 QRS Duration: 78 QT Interval:  378 QTC Calculation: 441 R Axis:   70 Text Interpretation:  Normal sinus rhythm Nonspecific T wave abnormality  Abnormal ECG No previous ECGs available Confirmed by YAO  MD, DAVID  ([id number]) on 05/10/2015 5:06:50 PM      MDM   Final diagnoses:  Severe frontal headaches  Chest pain, atypical   49 year old Caucasian female with chest pain and headache. Please see history of present illness for details. On exam patient in NAD, AF VSS.  Chest pain: Unlikely to be ACS given benign EKG with no signs of ischemic changes and troponin negative. Not consistent with dissection, aneurysm, pneumothorax, pneumonia. Chest x-ray benign as well. Not consistent with pericarditis or myocarditis. Pt is low risk by Wells, not c/w DVT/PE (not hypoxic, no tachypnea, no SOB now, no LE pain/swelling.) Headache: Red flags include worse with laying flat and acute onset. We'll obtain CT to look ICH abnormalities. Considered but not consistent with meningitis given no fevers, no neck pain and no other signs of infection. Possibly a migraine. We'll treat as such and reevaluate.  Both cp and HA resolved after migraine cocktail. Pt appears well. Stable for DC home. FU to PcP. Strict return if severe sx return.   Pt was seen under the supervision of Dr. Darl Householder.     Sherian Maroon, MD 05/10/15 2039  Wandra Arthurs, MD 05/10/15 [phone]

## 2015-07-15 ENCOUNTER — Telehealth: Payer: Self-pay | Admitting: *Deleted

## 2015-07-15 NOTE — Telephone Encounter (Signed)
Dr. Ardis Hughs, This pt is coming in for her PV next week for a screening colonoscopy on 08-06-15.  I noted on her record of an ED visit in December 2016 where she c/o of chest pain and a migraine.  Troponin and EKG came back negative for any ischemic changes; also symptoms resolved after migraine cocktail given.  She was encouraged to follow up with her PCP, which she didn't and also no further ED visits for this problem.  I just wanted to make sure she is ok for direct to [facility]; I know this was a one time instance, but just wanted to double check.  Thanks, [employer]

## 2015-07-15 NOTE — Telephone Encounter (Signed)
Please cancel PV, she needs to see her PCP first to determine if any other cardiac testing is needed.

## 2015-07-15 NOTE — Telephone Encounter (Signed)
Pt didn't have a number to reach her in her demographics, so I spoke with her sister (her emergency contact) who gave me her number.  I left a generic message on her machine for her to call our office

## 2015-07-16 NOTE — Telephone Encounter (Signed)
Spoke with patient.  She states she did see Ed 05-10-2015 but this chest pain was related to her medications, she has had no further issues.  Instructed per Dr Ardis Hughs, she needs to see her PCP to makes sure no further cardiac  testing is needed.  She states she wants to try  To see the PCP before her PV date Wednesday 3-8 so she doesnt have to cancel her colon. Instructed her this is ok but if the PCP states she has to have anything else done, we will have to cancel her colon until cardiac cleared. Pt verbalized understanding. Instructed her to get a letter from her PCP stating nothing needed further cardiac wise if he states this and she will call and cancel if he recommends any further testing.  Lelan Pons PV

## 2015-08-06 ENCOUNTER — Encounter: Payer: Self-pay | Admitting: Gastroenterology

## 2015-08-08 ENCOUNTER — Telehealth: Payer: Self-pay | Admitting: Gastroenterology

## 2015-08-08 NOTE — Telephone Encounter (Signed)
Spoke with patient and scheduled Direct colon on 10/06/15 at 2;30 PM and pre visit on 09/12/15 at 8:00 AM.

## 2015-08-08 NOTE — Telephone Encounter (Signed)
Carmen Perkins, she was seen by her PCP (following ER visit for chest pains) and was felt to be at low risk for cardiac issues during a colonoscopy and he 'cleared' her for the procedure.  Can you contact her and let her know I reviewed her records from Dr. Jeanie Cooks and we can go ahead and get her back on for direct screening colonoscopy.  Thanks

## 2015-08-13 ENCOUNTER — Encounter: Payer: Medicare Other | Admitting: Gastroenterology

## 2015-09-12 ENCOUNTER — Ambulatory Visit (AMBULATORY_SURGERY_CENTER): Payer: Self-pay

## 2015-09-12 VITALS — Ht 64.0 in | Wt 186.4 lb

## 2015-09-12 DIAGNOSIS — Z1211 Encounter for screening for malignant neoplasm of colon: Secondary | ICD-10-CM

## 2015-09-12 MED ORDER — POLYETHYLENE GLYCOL 3350 17 GM/SCOOP PO POWD: 1.0000 | Freq: Once | ORAL | Status: DC

## 2015-09-12 MED ORDER — BISACODYL 5 MG PO TBEC: 5.0000 mg | DELAYED_RELEASE_TABLET | Freq: Once | ORAL | Status: AC

## 2015-09-12 NOTE — Progress Notes (Signed)
No allergies to eggs or soy No past problem with anesthesia No home oxygen No diet meds  Has email and internet; registered emmi

## 2015-09-19 ENCOUNTER — Encounter: Payer: Medicare Other | Admitting: Gastroenterology

## 2015-10-03 ENCOUNTER — Encounter: Payer: Self-pay | Admitting: Gastroenterology

## 2015-10-17 ENCOUNTER — Encounter: Payer: Medicare Other | Admitting: Gastroenterology

## 2015-10-17 ENCOUNTER — Telehealth: Payer: Self-pay | Admitting: Gastroenterology

## 2015-10-17 NOTE — Telephone Encounter (Signed)
Phone call to patient. She states she has finished both prep doses and she is having clumpy stools. She reports taking a lot of pain medication and so has slower bowels. Reports her blood pressure is elevated and complaining of headache. She would like to reschedule. Cancelled 10/17/15 appointment and rescheduled colon and previst. Informed her that we will have to plan for a two day prep with next colon

## 2015-12-02 ENCOUNTER — Encounter: Payer: Self-pay | Admitting: Gastroenterology

## 2015-12-02 ENCOUNTER — Ambulatory Visit (AMBULATORY_SURGERY_CENTER): Payer: Self-pay

## 2015-12-02 VITALS — Ht 64.0 in | Wt 185.2 lb

## 2015-12-02 DIAGNOSIS — Z1211 Encounter for screening for malignant neoplasm of colon: Secondary | ICD-10-CM

## 2015-12-02 MED ORDER — SUPREP BOWEL PREP KIT 17.5-3.13-1.6 GM/177ML PO SOLN: 1.0000 | Freq: Once | ORAL | Status: DC

## 2015-12-02 NOTE — Progress Notes (Signed)
No allergies to eggs or soy No past problems with anesthesia No diet meds No home oxygen  Has email and internet registered for emmi 

## 2015-12-17 ENCOUNTER — Ambulatory Visit (AMBULATORY_SURGERY_CENTER): Payer: Medicare Other | Admitting: Gastroenterology

## 2015-12-17 ENCOUNTER — Encounter: Payer: Self-pay | Admitting: Gastroenterology

## 2015-12-17 VITALS — BP 135/90 | HR 73 | Temp 97.7°F | Resp 14 | Ht 64.0 in | Wt 185.0 lb

## 2015-12-17 DIAGNOSIS — D123 Benign neoplasm of transverse colon: Secondary | ICD-10-CM | POA: Diagnosis not present

## 2015-12-17 DIAGNOSIS — Z1211 Encounter for screening for malignant neoplasm of colon: Secondary | ICD-10-CM

## 2015-12-17 LAB — GLUCOSE, CAPILLARY
GLUCOSE-CAPILLARY: 133 mg/dL — AB (ref 65–99)
GLUCOSE-CAPILLARY: 98 mg/dL (ref 65–99)

## 2015-12-17 MED ORDER — SODIUM CHLORIDE 0.9 % IV SOLN: 500.0000 mL | INTRAVENOUS | Status: AC

## 2015-12-17 NOTE — Progress Notes (Signed)
Called to room to assist during endoscopic procedure.  Patient ID and intended procedure confirmed with present staff. Received instructions for my participation in the procedure from the performing physician.  

## 2015-12-17 NOTE — Patient Instructions (Signed)
Colon polyps removed today. Handout given on polyps. Result letter in your mail in 2-3 weeks. Resume current medications. Call us with any questions or concerns. Thank you!   YOU HAD AN ENDOSCOPIC PROCEDURE TODAY AT [facility]:   Refer to the procedure report that was given to you for any specific questions about what was found during the examination.  If the procedure report does not answer your questions, please call your gastroenterologist to clarify.  If you requested that your care partner not be given the details of your procedure findings, then the procedure report has been included in a sealed envelope for you to review at your convenience later.  YOU SHOULD EXPECT: Some feelings of bloating in the abdomen. Passage of more gas than usual.  Walking can help get rid of the air that was put into your GI tract during the procedure and reduce the bloating. If you had a lower endoscopy (such as a colonoscopy or flexible sigmoidoscopy) you may notice spotting of blood in your stool or on the toilet paper. If you underwent a bowel prep for your procedure, you may not have a normal bowel movement for a few days.  Please Note:  You might notice some irritation and congestion in your nose or some drainage.  This is from the oxygen used during your procedure.  There is no need for concern and it should clear up in a day or so.  SYMPTOMS TO REPORT IMMEDIATELY:   Following lower endoscopy (colonoscopy or flexible sigmoidoscopy):  Excessive amounts of blood in the stool  Significant tenderness or worsening of abdominal pains  Swelling of the abdomen that is new, acute  Fever of 100F or higher   For urgent or emergent issues, a gastroenterologist can be reached at any hour by calling [phone].   DIET: Your first meal following the procedure should be a small meal and then it is ok to progress to your normal diet. Heavy or fried foods are harder to digest and may make you feel  nauseous or bloated.  Likewise, meals heavy in dairy and vegetables can increase bloating.  Drink plenty of fluids but you should avoid alcoholic beverages for 24 hours.  ACTIVITY:  You should plan to take it easy for the rest of today and you should NOT DRIVE or use heavy machinery until tomorrow (because of the sedation medicines used during the test).    FOLLOW UP: Our staff will call the number listed on your records the next business day following your procedure to check on you and address any questions or concerns that you may have regarding the information given to you following your procedure. If we do not reach you, we will leave a message.  However, if you are feeling well and you are not experiencing any problems, there is no need to return our call.  We will assume that you have returned to your regular daily activities without incident.  If any biopsies were taken you will be contacted by phone or by letter within the next 1-3 weeks.  Please call us at [phone] if you have not heard about the biopsies in 3 weeks.    SIGNATURES/CONFIDENTIALITY: You and/or your care partner have signed paperwork which will be entered into your electronic medical record.  These signatures attest to the fact that that the information above on your After Visit Summary has been reviewed and is understood.  Full responsibility of the confidentiality of this discharge information lies  with you and/or your care-partner.

## 2015-12-17 NOTE — Op Note (Signed)
[facility] Patient Name: Carmen Perkins Procedure Date: 12/17/2015 9:03 AM MRN: [id number] Endoscopist: Milus Banister , MD Age: 50 Referring MD:  Date of Birth: March 28, 1966 Gender: Female Account #: [id number] Procedure:                Colonoscopy Indications:              Screening for colorectal malignant neoplasm, This                            is the patient's first colonoscopy Medicines:                Monitored Anesthesia Care Procedure:                Pre-Anesthesia Assessment:                           - Prior to the procedure, a History and Physical                            was performed, and patient medications and                            allergies were reviewed. The patient's tolerance of                            previous anesthesia was also reviewed. The risks                            and benefits of the procedure and the sedation                            options and risks were discussed with the patient.                            All questions were answered, and informed consent                            was obtained. Prior Anticoagulants: The patient has                            taken no previous anticoagulant or antiplatelet                            agents. ASA Grade Assessment: II - A patient with                            mild systemic disease. After reviewing the risks                            and benefits, the patient was deemed in                            satisfactory condition to undergo the procedure.  After obtaining informed consent, the colonoscope                            was passed under direct vision. Throughout the                            procedure, the patient's blood pressure, pulse, and                            oxygen saturations were monitored continuously. The                            Model PCF-H190L [id number]) scope was introduced                            through the anus and  advanced to the the cecum,                            identified by appendiceal orifice and ileocecal                            valve. The colonoscopy was performed without                            difficulty. The patient tolerated the procedure                            well. The quality of the bowel preparation was                            excellent. The ileocecal valve, appendiceal                            orifice, and rectum were photographed. Scope In: 9:11:50 AM Scope Out: 9:24:15 AM Scope Withdrawal Time: 0 hours 8 minutes 9 seconds  Total Procedure Duration: 0 hours 12 minutes 25 seconds  Findings:                 Two sessile polyps were found in the transverse                            colon. The polyps were 3 to 5 mm in size. These                            polyps were removed with a cold snare. Resection                            and retrieval were complete.                           The exam was otherwise without abnormality on                            direct and retroflexion views. Complications:  No immediate complications. Estimated blood loss:                            None. Estimated Blood Loss:     Estimated blood loss: none. Impression:               - Two 3 to 5 mm polyps in the transverse colon,                            removed with a cold snare. Resected and retrieved.                           - The examination was otherwise normal on direct                            and retroflexion views. Recommendation:           - Patient has a contact number available for                            emergencies. The signs and symptoms of potential                            delayed complications were discussed with the                            patient. Return to normal activities tomorrow.                            Written discharge instructions were provided to the                            patient.                           - Resume previous  diet.                           - Continue present medications.                           You will receive a letter within 2-3 weeks with the                            pathology results and my final recommendations.                           If the polyp(s) is proven to be 'pre-cancerous' on                            pathology, you will need repeat colonoscopy in 5                            years. If the polyp(s) is NOT 'precancerous' on  pathology then you should repeat colon cancer                            screening in 10 years with colonoscopy without need                            for colon cancer screening by any method prior to                            then (including stool testing). Milus Banister, MD 12/17/2015 9:26:10 AM This report has been signed electronically.

## 2015-12-17 NOTE — Progress Notes (Signed)
Report to PACU, RN, vss, BBS= Clear.  

## 2015-12-18 ENCOUNTER — Telehealth: Payer: Self-pay

## 2015-12-18 NOTE — Telephone Encounter (Signed)
  Follow up Call-  Call back number 12/17/2015  Post procedure Call Back phone  # [phone]  Permission to leave phone message Yes  Some recent data might be hidden     Patient questions:  Do you have a fever, pain , or abdominal swelling? No. Pain Score  0 *  Have you tolerated food without any problems? Yes.    Have you been able to return to your normal activities? Yes.    Do you have any questions about your discharge instructions: Diet   No. Medications  No. Follow up visit  No.  Do you have questions or concerns about your Care? No.  Actions: * If pain score is 4 or above: No action needed, pain <4.

## 2015-12-24 ENCOUNTER — Encounter: Payer: Self-pay | Admitting: Gastroenterology

## 2016-05-25 IMAGING — DX DG CHEST 2V
2 series · 2 of 2 positions shown · non-contrast
Comparison: PA and lateral chest 11/18/2005.

CLINICAL DATA: Midsternal chest pain for 4 days. Initial encounter.

EXAM:
CHEST  2 VIEW

[chest pa]
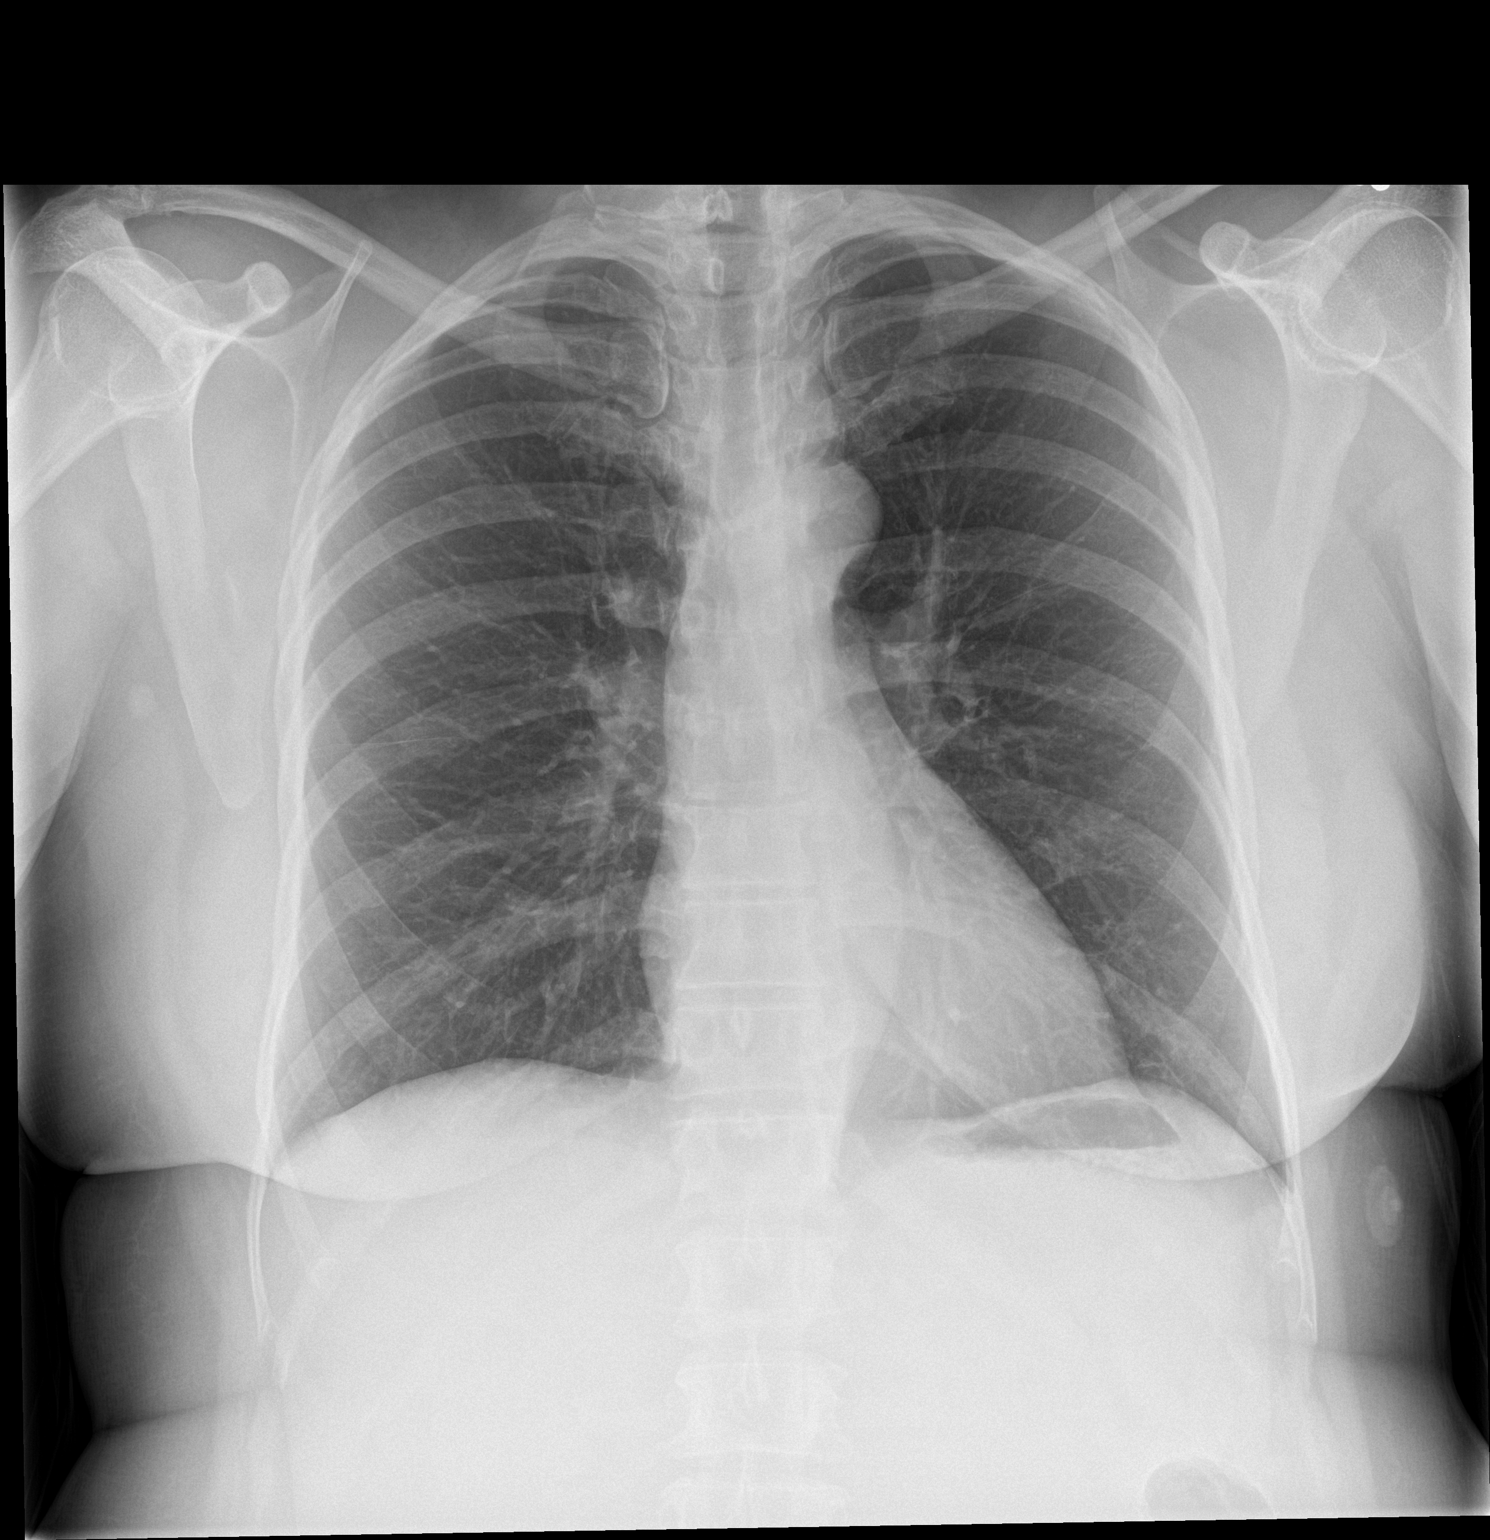

[chest lat]
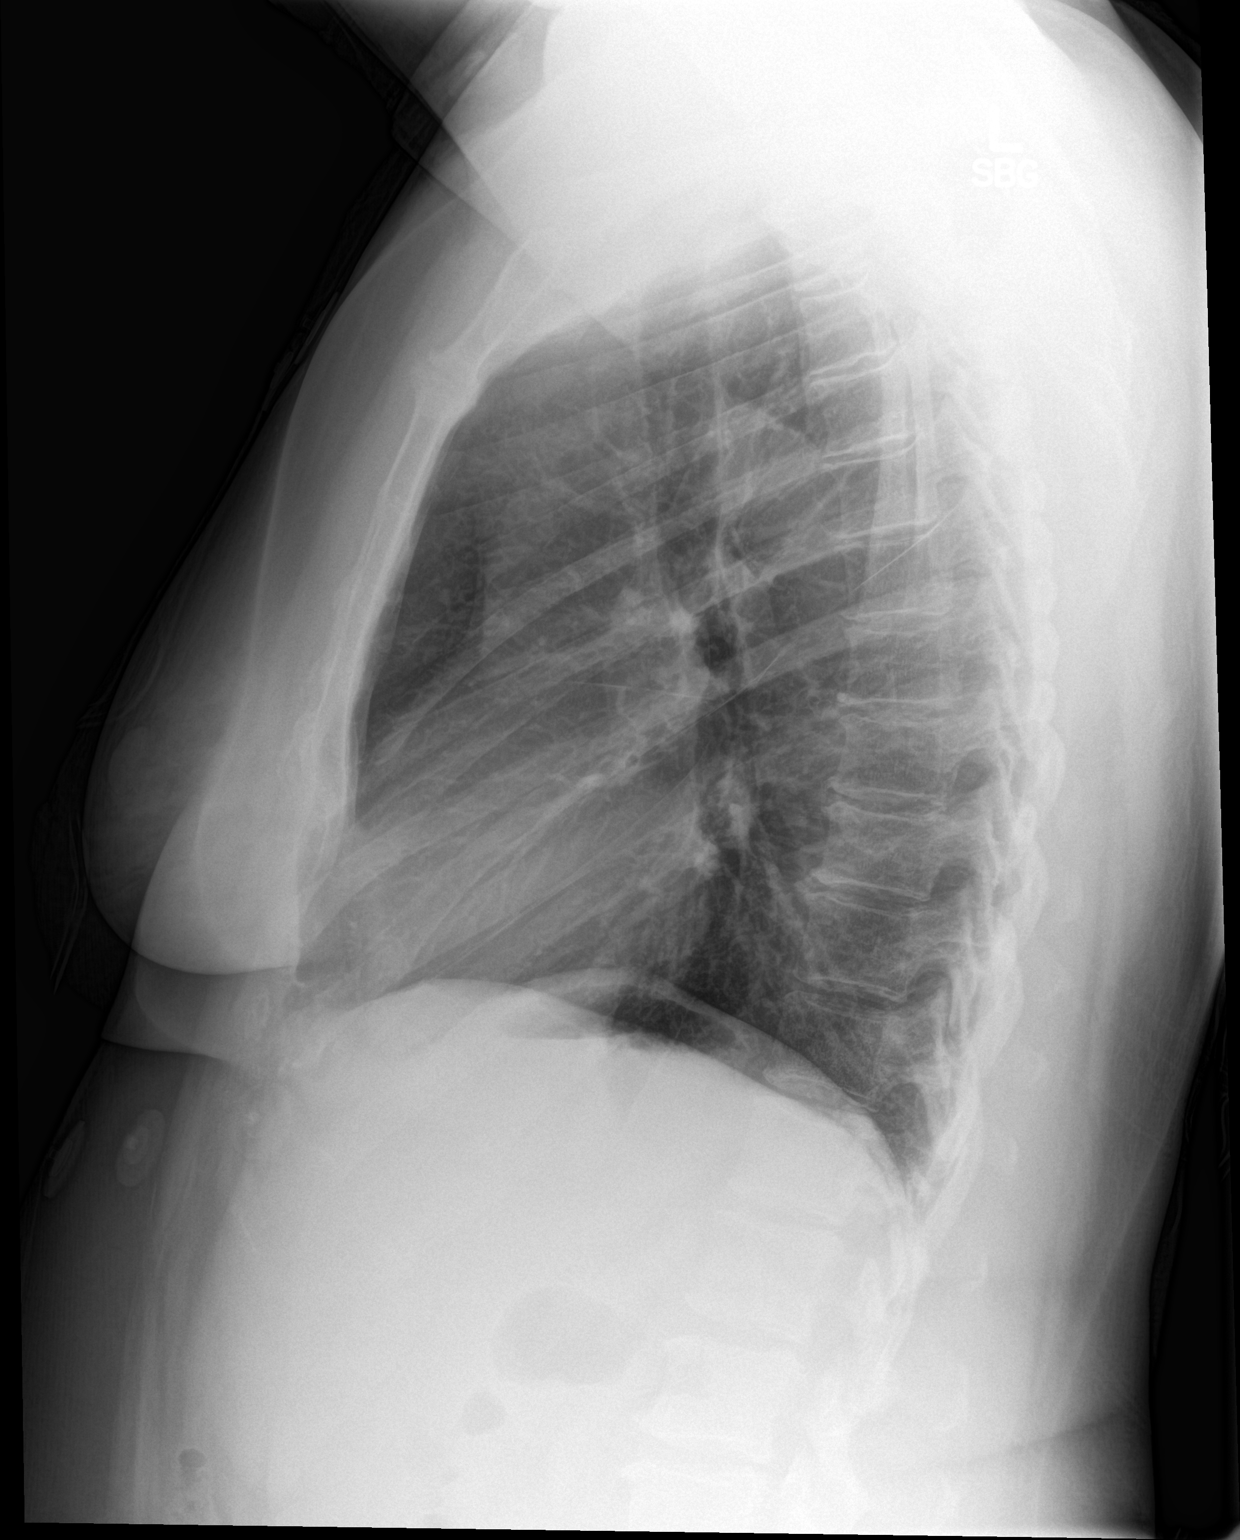

[2 of 2 positions shown; findings below may reference images not displayed]

FINDINGS: The lungs are clear. Heart size is normal. No pneumothorax or
pleural effusion. No bony abnormality
IMPRESSION: Normal chest.

## 2016-05-25 IMAGING — CT CT HEAD W/O CM
1 of 2 series · 16 of 30 positions shown, 20 images · non-contrast
Comparison: None.

CLINICAL DATA: Headache with nausea and left shoulder pain. Initial
encounter.

EXAM:
CT HEAD WITHOUT CONTRAST
TECHNIQUE: Contiguous axial images were obtained from the base of the skull
through the vertex without intravenous contrast.

[Series 3: head 2.0 h70h · axial · 0.41mm/px · z∈[-112,+28]mm · 16 of 78 slices shown, 20 images]
[im 4/78  brain]
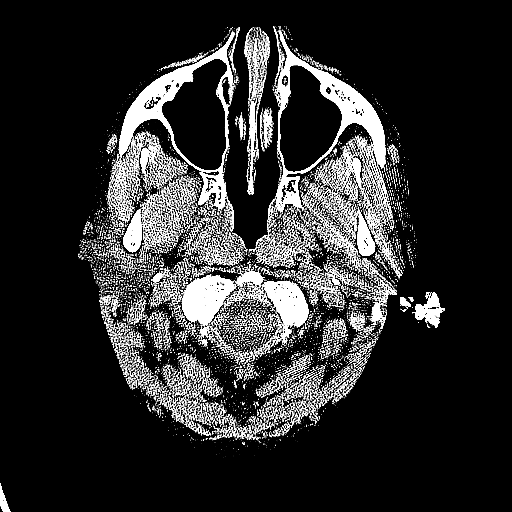
[im 4/78  bone]
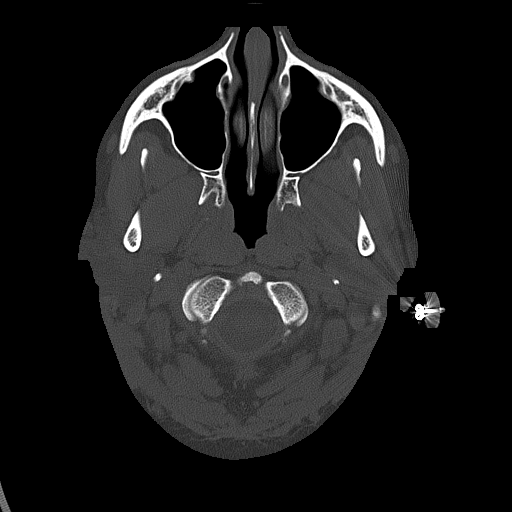
[im 8/78  brain]
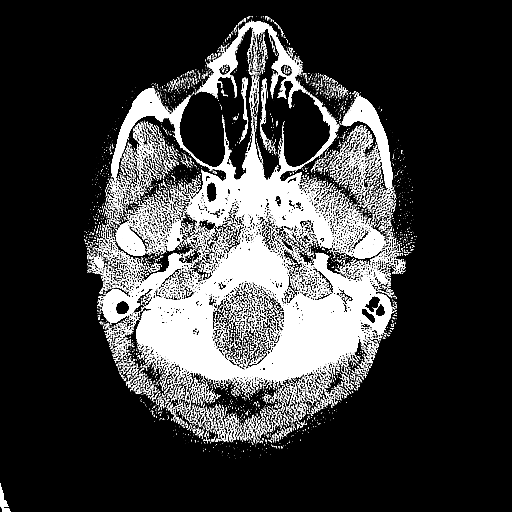
[im 12/78  brain]
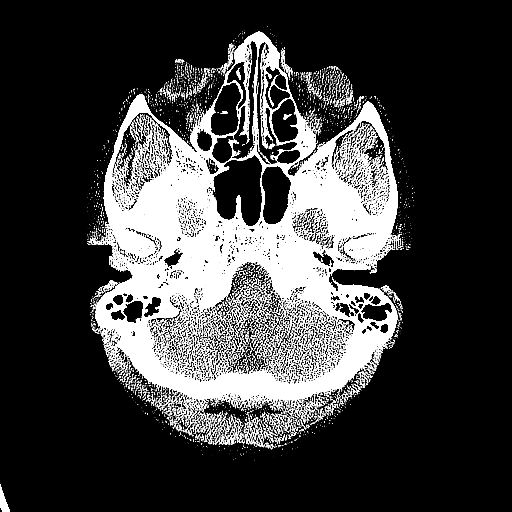
[im 20/78  brain]
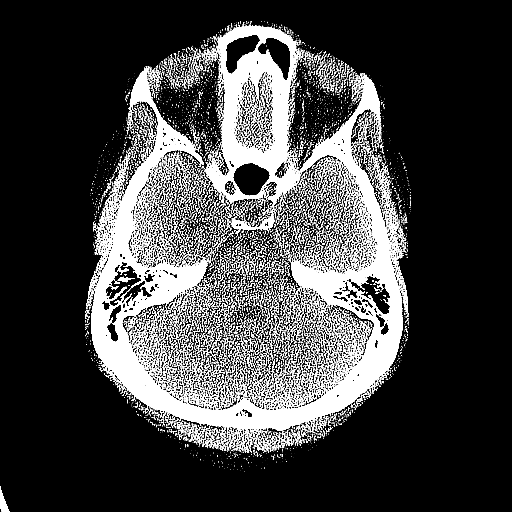
[im 24/78  brain]
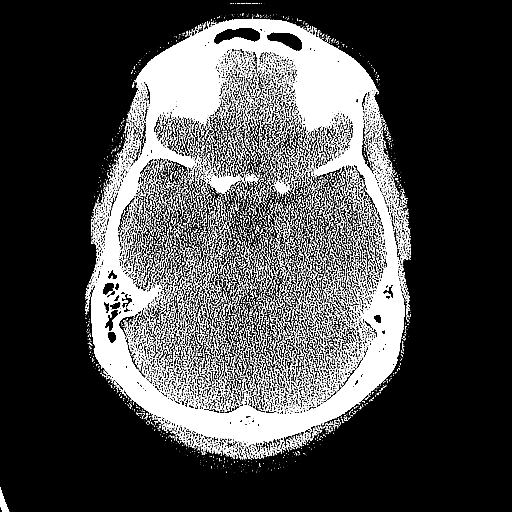
[im 24/78  bone]
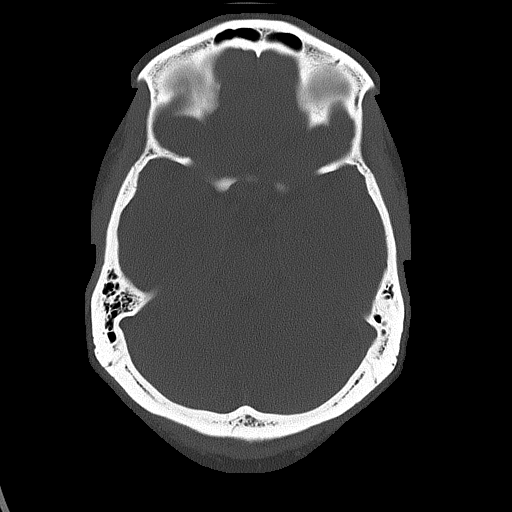
[im 27/78  brain]
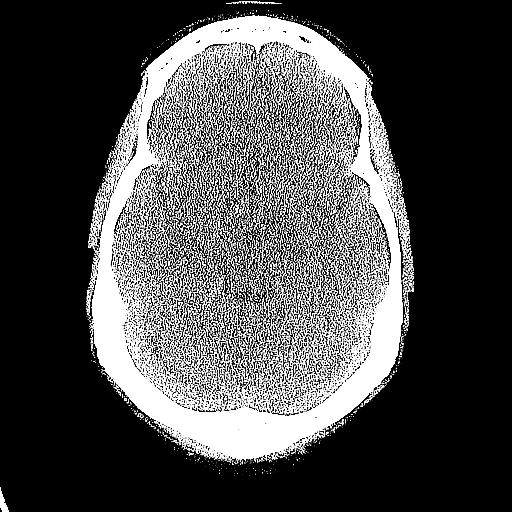
[im 31/78  brain]
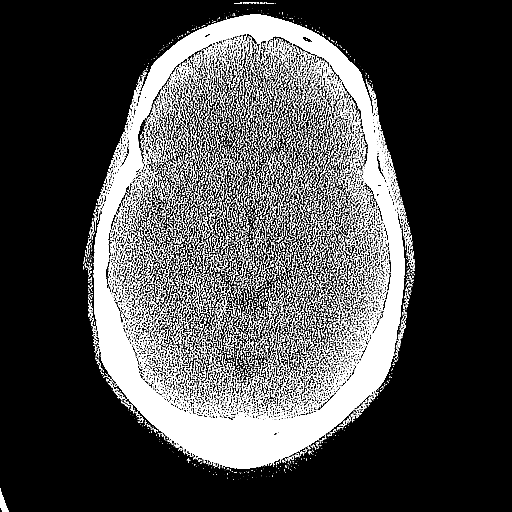
[im 35/78  brain]
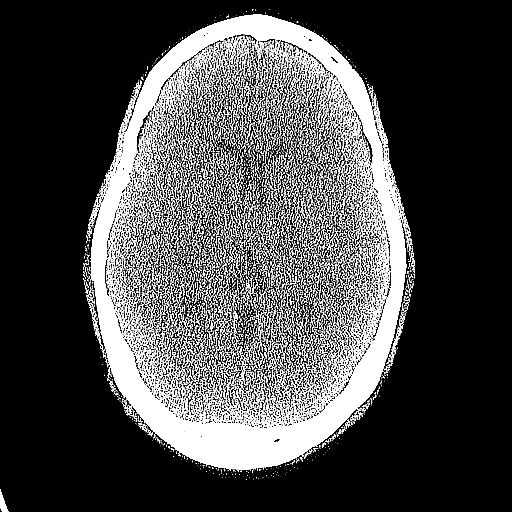
[im 43/78  brain]
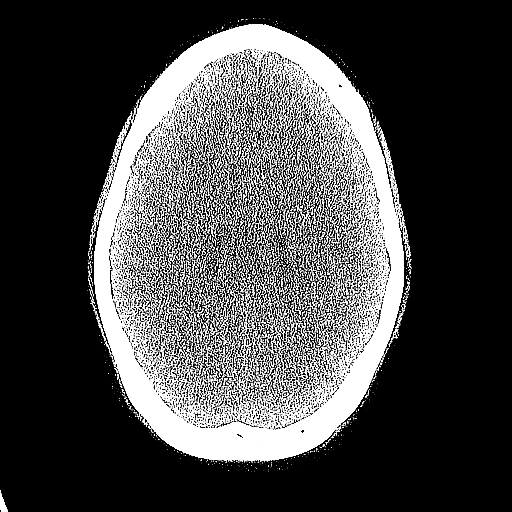
[im 43/78  bone]
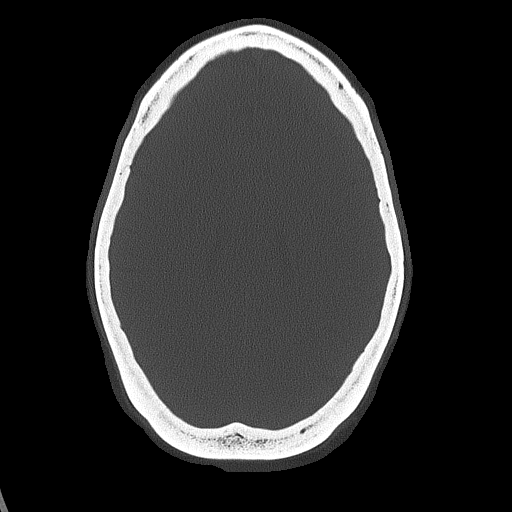
[im 47/78  brain]
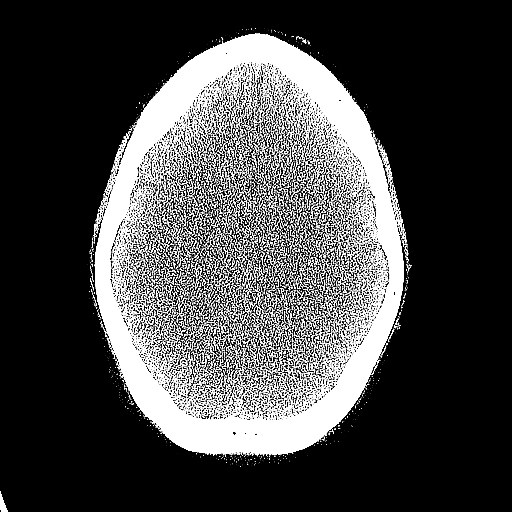
[im 51/78  brain]
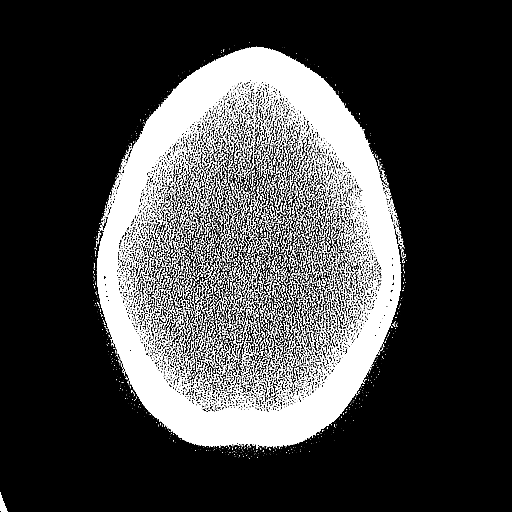
[im 54/78  brain]
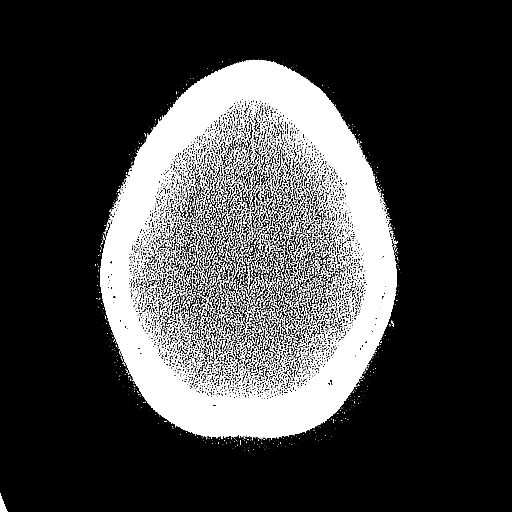
[im 58/78  brain]
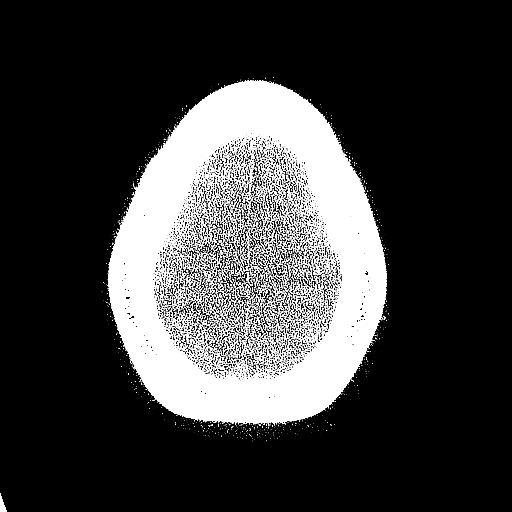
[im 58/78  bone]
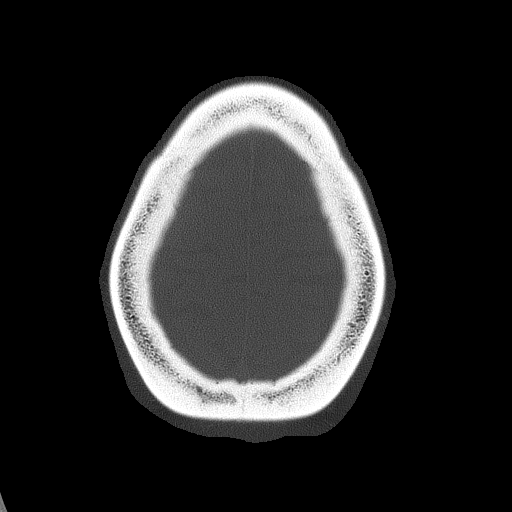
[im 66/78  brain]
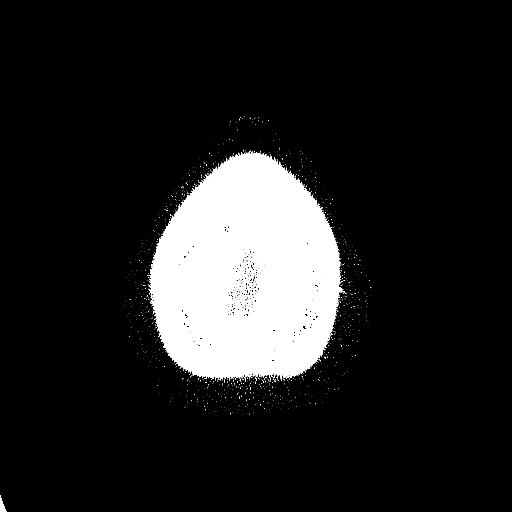
[im 70/78  brain]
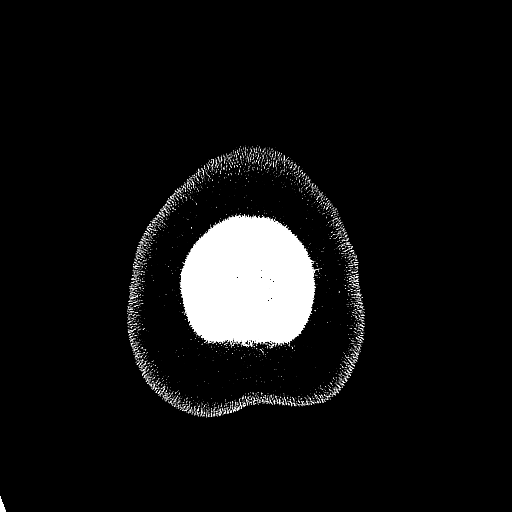
[im 74/78  brain]
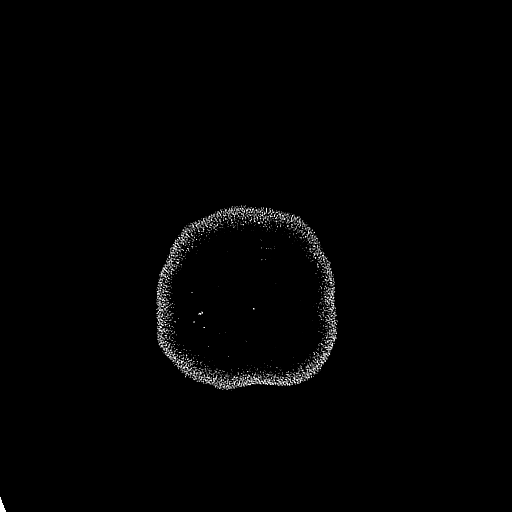

[16 of 30 positions shown; findings below may reference images not displayed]

FINDINGS: There is no evidence of acute intracranial hemorrhage, mass lesion,
brain edema or extra-axial fluid collection. The ventricles and
subarachnoid spaces are appropriately sized for age. There is no CT
evidence of acute cortical infarction.

There is minimal mucosal thickening or mucous retention cyst
formation in the ethmoid sinuses. The visualized paranasal sinuses,
mastoid air cells and middle ears are otherwise clear. Sclerotic
lesion in the left skull base on image number 9 has a nonaggressive
appearance. No acute osseous findings.
IMPRESSION: No acute intracranial findings. No explanation for the patient's
symptoms.

## 2017-02-22 ENCOUNTER — Other Ambulatory Visit: Payer: Self-pay | Admitting: Internal Medicine

## 2017-02-22 DIAGNOSIS — Z1231 Encounter for screening mammogram for malignant neoplasm of breast: Secondary | ICD-10-CM

## 2017-03-16 ENCOUNTER — Ambulatory Visit: Payer: Medicare Other

## 2017-03-23 ENCOUNTER — Ambulatory Visit
Admission: RE | Admit: 2017-03-23 | Discharge: 2017-03-23 | Disposition: A | Discharge status: Home / Self Care | Payer: Medicare Other | Source: Ambulatory Visit | Attending: Internal Medicine | Admitting: Internal Medicine

## 2017-03-23 DIAGNOSIS — Z1231 Encounter for screening mammogram for malignant neoplasm of breast: Secondary | ICD-10-CM

## 2018-06-21 ENCOUNTER — Other Ambulatory Visit: Payer: Self-pay | Admitting: Internal Medicine

## 2018-06-21 DIAGNOSIS — Z1231 Encounter for screening mammogram for malignant neoplasm of breast: Secondary | ICD-10-CM

## 2018-07-18 ENCOUNTER — Ambulatory Visit: Payer: Medicare Other

## 2018-08-16 ENCOUNTER — Ambulatory Visit: Payer: Medicare Other

## 2018-10-06 ENCOUNTER — Ambulatory Visit: Payer: Medicare Other

## 2018-10-28 ENCOUNTER — Other Ambulatory Visit: Payer: Self-pay

## 2018-10-28 ENCOUNTER — Ambulatory Visit
Admission: RE | Admit: 2018-10-28 | Discharge: 2018-10-28 | Disposition: A | Discharge status: Home / Self Care | Payer: Medicare Other | Source: Ambulatory Visit | Attending: Internal Medicine | Admitting: Internal Medicine

## 2018-10-28 DIAGNOSIS — Z1231 Encounter for screening mammogram for malignant neoplasm of breast: Secondary | ICD-10-CM

## 2019-04-13 ENCOUNTER — Encounter (HOSPITAL_COMMUNITY): Payer: Self-pay

## 2019-04-13 ENCOUNTER — Emergency Department (HOSPITAL_COMMUNITY)
Admission: EM | Admit: 2019-04-13 | Discharge: 2019-04-14 | Disposition: A | Discharge status: Home / Self Care | Payer: Medicare Other | Attending: Emergency Medicine | Admitting: Emergency Medicine

## 2019-04-13 ENCOUNTER — Other Ambulatory Visit: Payer: Self-pay

## 2019-04-13 DIAGNOSIS — Z5321 Procedure and treatment not carried out due to patient leaving prior to being seen by health care provider: Secondary | ICD-10-CM | POA: Diagnosis not present

## 2019-04-13 DIAGNOSIS — H5712 Ocular pain, left eye: Secondary | ICD-10-CM | POA: Insufficient documentation

## 2019-04-13 NOTE — ED Triage Notes (Signed)
Pt presents with c/o left eye pain. Pt states she was cleaning up debris outside around noon, states she usually wears glasses but thinks she got something in her eye. Pt states she washed out her eye several times, states her significant other was unable to see any debris in her eye. Pt has notable left eye swelling and redness. States she has glaucoma bilaterally.

## 2019-04-14 NOTE — ED Notes (Signed)
Pt understands there is a wait and will just go to the [place] in the morning

## 2019-04-15 ENCOUNTER — Emergency Department (HOSPITAL_COMMUNITY)
Admission: EM | Admit: 2019-04-15 | Discharge: 2019-04-15 | Disposition: A | Discharge status: Home / Self Care | Payer: Medicare Other | Attending: Emergency Medicine | Admitting: Emergency Medicine

## 2019-04-15 ENCOUNTER — Other Ambulatory Visit: Payer: Self-pay

## 2019-04-15 DIAGNOSIS — Y939 Activity, unspecified: Secondary | ICD-10-CM | POA: Diagnosis not present

## 2019-04-15 DIAGNOSIS — Z7984 Long term (current) use of oral hypoglycemic drugs: Secondary | ICD-10-CM | POA: Diagnosis not present

## 2019-04-15 DIAGNOSIS — Z79899 Other long term (current) drug therapy: Secondary | ICD-10-CM | POA: Diagnosis not present

## 2019-04-15 DIAGNOSIS — Y929 Unspecified place or not applicable: Secondary | ICD-10-CM | POA: Insufficient documentation

## 2019-04-15 DIAGNOSIS — T1592XA Foreign body on external eye, part unspecified, left eye, initial encounter: Secondary | ICD-10-CM | POA: Diagnosis present

## 2019-04-15 DIAGNOSIS — Y999 Unspecified external cause status: Secondary | ICD-10-CM | POA: Diagnosis not present

## 2019-04-15 DIAGNOSIS — I10 Essential (primary) hypertension: Secondary | ICD-10-CM | POA: Insufficient documentation

## 2019-04-15 DIAGNOSIS — E119 Type 2 diabetes mellitus without complications: Secondary | ICD-10-CM | POA: Insufficient documentation

## 2019-04-15 DIAGNOSIS — X58XXXA Exposure to other specified factors, initial encounter: Secondary | ICD-10-CM | POA: Insufficient documentation

## 2019-04-15 MED ORDER — TETRACAINE HCL 0.5 % OP SOLN
1.0000 [drp] | Freq: Once | OPHTHALMIC | Status: AC
  Administered 2019-04-15: 2 [drp] via OPHTHALMIC
  Filled 2019-04-15: qty 4

## 2019-04-15 MED ORDER — FLUORESCEIN SODIUM 1 MG OP STRP
1.0000 | ORAL_STRIP | Freq: Once | OPHTHALMIC | Status: AC
  Administered 2019-04-15: 1 via OPHTHALMIC
  Filled 2019-04-15: qty 1

## 2019-04-15 NOTE — ED Provider Notes (Signed)
[facility] DEPT Provider Note   CSN: [id number] Arrival date & time: 04/15/19  1100     History   Chief Complaint Chief Complaint  Patient presents with  . Eye Problem    HPI Carmen Perkins is a 53 y.o. female who presents with L eye pain.  Patient states that she started having gradually worsening left eye pain for the past 2 days.  She is on sure if something got in her eye.  She was cleaning of glass, she was blowing and raking leaves, and she went to the vet on Friday with her dog.  She started to have foreign body sensation and eye irritation with photosensitivity.  She flushed her eye out and this made her symptoms worse.  She came to the ED last night but left without being seen due to long wait times.  She decided to follow-up with the [facility] where she gets her regular care however they did not have an urgent appointment available therefore they referred her to [facility].  She went there and the provider told her that they would not be able to evaluate her for this problem and sent her back to the ED.  She does not wear contacts or glasses.  She denies trauma to the eye.  She does not have any vision changes but is having difficulty opening her eye due to pain.  She feels like the irritation is moving around her eye     HPI  Past Medical History:  Diagnosis Date  . Anxiety   . Bronchitis   . Depression   . Diabetes ([place])   . Environmental allergies   . Environmental allergies   . GERD (gastroesophageal reflux disease)   . Glaucoma   . Hypertension   . PTSD (post-traumatic stress disorder)     There are no active problems to display for this patient.   Past Surgical History:  Procedure Laterality Date  . ABDOMINAL HYSTERECTOMY       OB History   No obstetric history on file.      Home Medications    Prior to Admission medications   Medication Sig Start Date End Date Taking? Authorizing Provider  albuterol (PROVENTIL  HFA;VENTOLIN HFA) 108 (90 BASE) MCG/ACT inhaler Inhale 2 puffs into the lungs every 4 (four) hours as needed. For wheezing or shortness of breath.    [provider]  amLODipine (NORVASC) 10 MG tablet Take 10 mg by mouth daily.    [provider]  bisacodyl (BISACODYL) 5 MG EC tablet Take 1 tablet (5 mg total) by mouth once. 09/12/15   Milus Banister, MD  Calcium Carbonate-Vitamin D (CALCIUM 600 + D PO) Take 1 tablet by mouth 2 (two) times daily.    [provider]  carvedilol (COREG) 25 MG tablet Take 25 mg by mouth 2 (two) times daily. 12/06/15   [provider]  cetirizine (ZYRTEC) 10 MG tablet Take 10 mg by mouth daily.    [provider]  hydrochlorothiazide (HYDRODIURIL) 25 MG tablet Take 25 mg by mouth daily.    [provider]  ibuprofen (ADVIL,MOTRIN) 800 MG tablet Take 800 mg by mouth every 8 (eight) hours as needed. For back pain.    [provider]  losartan (COZAAR) 100 MG tablet Take 100 mg by mouth daily. 02/28/15   [provider]  metFORMIN (GLUCOPHAGE) 500 MG tablet Take 250 mg by mouth daily with breakfast.    [provider]  metoprolol tartrate (LOPRESSOR) 25 MG tablet Take 25 mg by mouth 2 (two) times daily.    [provider]  Multiple Vitamin (MULTIVITAMIN WITH MINERALS) TABS tablet Take 1 tablet by mouth daily.    [provider]  PARoxetine (PAXIL) 40 MG tablet Take 60 mg by mouth daily.    [provider]  polyvinyl alcohol (LIQUIFILM TEARS) 1.4 % ophthalmic solution Place 1 drop into both eyes as needed. For dry eyes.    [provider]  potassium chloride SA (K-DUR,KLOR-CON) 20 MEQ tablet Take 20 mEq by mouth 2 (two) times daily.    [provider]  sennosides-docusate sodium (SENOKOT-S) 8.6-50 MG tablet Take 2 tablets by mouth 2 (two) times daily.    [provider]  vitamin C (ASCORBIC ACID) 250 MG tablet Take 500 mg by mouth daily.     [provider]    Family History Family History  Problem Relation Age of Onset  . Colon cancer Neg Hx     Social History Social History   Tobacco Use  . Smoking status: Never Smoker  . Smokeless tobacco: Never Used  Substance Use Topics  . Alcohol use: Yes    Alcohol/week: 0.0 standard drinks    Comment: occasional  . Drug use: No     Allergies   Patient has no known allergies.   Review of Systems Review of Systems  Constitutional: Negative for fever.  Eyes: Positive for photophobia, pain and redness. Negative for discharge, itching and visual disturbance.  All other systems reviewed and are negative.    Physical Exam Updated Vital Signs BP (!) 168/109   Pulse 81   Temp 98.5 F (36.9 C) (Oral)   Resp 18   SpO2 99%   Physical Exam Vitals signs and nursing note reviewed.  Constitutional:      General: She is not in acute distress.    Appearance: Normal appearance. She is well-developed. She is not ill-appearing.  HENT:     Head: Normocephalic and atraumatic.  Eyes:     General: Lids are normal. Lids are everted, no foreign bodies appreciated. Vision grossly intact. Gaze aligned appropriately. No scleral icterus.       Right eye: No foreign body or discharge.        Left eye: No foreign body or discharge.     Intraocular pressure: Left eye pressure is 18 mmHg. Measurements were taken using an automated tonometer.    Extraocular Movements: Extraocular movements intact.     Conjunctiva/sclera:     Right eye: Right conjunctiva is not injected.     Left eye: Left conjunctiva is injected.     Pupils: Pupils are equal, round, and reactive to light. Pupils are equal.     Comments: IOP: 18 in the left.  Neck:     Musculoskeletal: Normal range of motion.  Cardiovascular:     Rate and Rhythm: Normal rate.  Pulmonary:     Effort: Pulmonary effort is normal. No respiratory distress.  Abdominal:     General: There is no distension.  Skin:    General:  Skin is warm and dry.  Neurological:     Mental Status: She is alert and oriented to person, place, and time.  Psychiatric:        Behavior: Behavior normal.      ED Treatments / Results  Labs (all labs ordered are listed, but only abnormal results are displayed) Labs Reviewed - No data to display  EKG None  Radiology No results found.  Procedures Procedures (including critical care time)  Medications Ordered in ED Medications  fluorescein ophthalmic strip 1 strip (1 strip Both Eyes Given 04/15/19 1211)  tetracaine (PONTOCAINE) 0.5 % ophthalmic solution 1-2 drop (2 drops Both Eyes Given 04/15/19 1211)     Initial Impression / Assessment and Plan / ED Course  I have reviewed the triage vital signs and the nursing notes.  Pertinent labs & imaging results that were available during my care of the patient were reviewed by me and considered in my medical decision making (see chart for details).  53 year old female presents with left eye pain, redness, aggravation.  She is hypertensive but otherwise vital signs are normal.  There are no obvious abnormalities on her eye exam.  She had improvement in her pain after tetracaine but there was no obvious corneal abrasion with fluorescein staining.  Intraocular pressure was measured and is 18.  She is still is reporting foreign body sensation.  Will irrigate the eye and reevaluate  After irrigation by nursing she feels much better.  She states that the sensation is totally gone.  We will have her follow-up with her eye doctor at the [facility] as needed.  Final Clinical Impressions(s) / ED Diagnoses   Final diagnoses:  FB eye, left, initial encounter    ED Discharge Orders    None       Recardo Evangelist, PA-C 04/16/19 0755    Lacretia Leigh, MD 04/17/19 1059

## 2019-04-15 NOTE — Discharge Instructions (Signed)
Please follow up with your eye doctor. 

## 2019-04-15 NOTE — ED Triage Notes (Signed)
Pt BIB [facility] from [facility]. Pt c/o pain in her L eye and blurred vision. Pt thought that she may have gotten something in her eye and scratched her cornea. Pt went to [facility] yesterday but LWBS due to the wait. Pt has hx of glaucoma. Pt sent here from [facility] for evaluation.

## 2019-04-15 NOTE — ED Notes (Addendum)
PT was only able to tolerate irrigation for a short time. Once the morgan lens was removed the pt stated that she did not feel the foreign body anymore.

## 2019-11-23 ENCOUNTER — Other Ambulatory Visit: Payer: Self-pay | Admitting: Family Medicine

## 2019-11-23 DIAGNOSIS — Z1231 Encounter for screening mammogram for malignant neoplasm of breast: Secondary | ICD-10-CM

## 2019-12-14 ENCOUNTER — Ambulatory Visit: Payer: Medicare Other

## 2020-01-17 ENCOUNTER — Ambulatory Visit: Payer: Medicare Other

## 2020-01-18 ENCOUNTER — Ambulatory Visit
Admission: RE | Admit: 2020-01-18 | Discharge: 2020-01-18 | Disposition: A | Discharge status: Home / Self Care | Payer: Medicare Other | Source: Ambulatory Visit | Attending: Family Medicine | Admitting: Family Medicine

## 2020-01-18 ENCOUNTER — Other Ambulatory Visit: Payer: Self-pay

## 2020-01-18 DIAGNOSIS — Z1231 Encounter for screening mammogram for malignant neoplasm of breast: Secondary | ICD-10-CM

## 2020-06-19 ENCOUNTER — Ambulatory Visit: Payer: No Typology Code available for payment source

## 2020-07-02 ENCOUNTER — Ambulatory Visit: Payer: No Typology Code available for payment source | Admitting: Physical Therapy

## 2020-07-03 ENCOUNTER — Other Ambulatory Visit: Payer: Self-pay

## 2020-07-03 ENCOUNTER — Ambulatory Visit: Payer: No Typology Code available for payment source | Attending: Neurosurgery | Admitting: Physical Therapy

## 2020-07-03 ENCOUNTER — Encounter: Payer: Self-pay | Admitting: Physical Therapy

## 2020-07-03 DIAGNOSIS — M79605 Pain in left leg: Secondary | ICD-10-CM | POA: Insufficient documentation

## 2020-07-03 DIAGNOSIS — M545 Low back pain, unspecified: Secondary | ICD-10-CM | POA: Insufficient documentation

## 2020-07-03 DIAGNOSIS — G8929 Other chronic pain: Secondary | ICD-10-CM | POA: Diagnosis present

## 2020-07-03 DIAGNOSIS — M6281 Muscle weakness (generalized): Secondary | ICD-10-CM | POA: Diagnosis not present

## 2020-07-03 DIAGNOSIS — M62830 Muscle spasm of back: Secondary | ICD-10-CM | POA: Insufficient documentation

## 2020-07-03 NOTE — Patient Instructions (Signed)
Access Code: [email or web address] URL: [email or web address] Date: 07/03/2020 Prepared by: Hilda Blades  Exercises  Seated Hamstring Stretch - 2-3 x daily - 7 x weekly - 2 reps - 15-30 seconds hold Seated Piriformis Stretch - 2-3 x daily - 7 x weekly - 2 reps - 15-30 seconds hold Seated Hip Stretch - 2-3 x daily - 7 x weekly - 2 reps - 15-30 seconds hold Seated Hip Abduction with Resistance - 2 x daily - 7 x weekly - 1-2 sets - 10 reps
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
## 2020-07-24 ENCOUNTER — Ambulatory Visit: Payer: No Typology Code available for payment source | Attending: Neurosurgery | Admitting: Physical Therapy

## 2020-07-24 ENCOUNTER — Encounter: Payer: Self-pay | Admitting: Physical Therapy

## 2020-07-24 ENCOUNTER — Other Ambulatory Visit: Payer: Self-pay

## 2020-07-24 DIAGNOSIS — M62830 Muscle spasm of back: Secondary | ICD-10-CM | POA: Insufficient documentation

## 2020-07-24 DIAGNOSIS — M79605 Pain in left leg: Secondary | ICD-10-CM | POA: Insufficient documentation

## 2020-07-24 DIAGNOSIS — M545 Low back pain, unspecified: Secondary | ICD-10-CM | POA: Diagnosis present

## 2020-07-24 DIAGNOSIS — M6281 Muscle weakness (generalized): Secondary | ICD-10-CM | POA: Diagnosis present

## 2020-07-24 DIAGNOSIS — G8929 Other chronic pain: Secondary | ICD-10-CM | POA: Diagnosis present

## 2020-07-24 NOTE — Therapy (Signed)
[place], [place], [address] Phone: [phone]   Fax:  [phone]  Physical Therapy Treatment  Patient Details  Name: Carmen Perkins MRN: [id number] Date of Birth: 11-05-1965 Referring Provider (PT): Earnie Larsson, MD   Encounter Date: 07/24/2020   PT End of Session - 07/24/20 1727    Visit Number 2    Number of Visits 6    Date for PT Re-Evaluation 08/14/20    Authorization Type [facility] [employer] [facility]/Medicare    Authorization Time Period 05/25/20-11/21/20    Authorization - Visit Number 2    Authorization - Number of Visits 15    PT Start Time 7989    PT Stop Time 1717    PT Time Calculation (min) 42 min    Activity Tolerance Patient limited by pain    Behavior During Therapy [facility] for tasks assessed/performed           Past Medical History:  Diagnosis Date  . Anxiety   . Bronchitis   . Depression   . Diabetes ([place])   . Environmental allergies   . Environmental allergies   . GERD (gastroesophageal reflux disease)   . Glaucoma   . Hypertension   . PTSD (post-traumatic stress disorder)     Past Surgical History:  Procedure Laterality Date  . ABDOMINAL HYSTERECTOMY      There were no vitals filed for this visit.   Subjective Assessment - 07/24/20 1638    Subjective Pt. reports status about the same as at eval with back pain and reports that one of her dogs knocked a stool into the medial aspect of her left ankle/lower leg earlier this week so noting some discomfort in this area as well. She reports (ankle) had been swollen after this but seems to be improving though still having pain.    Pertinent History PTSD    Currently in Pain? Yes    Pain Score 7     Pain Location Back    Pain Orientation Left    Pain Descriptors / Indicators Sharp    Pain Type Chronic pain    Pain Radiating Towards left leg and ankle    Pain Onset More than a month ago    Pain Frequency Intermittent     Aggravating Factors  prolonged standing for activities such as washing dishes, chores/sweeping    Pain Relieving Factors rest and medication    Effect of Pain on Daily Activities limited standing/mobility tolerance                             [facility] Adult PT Treatment/Exercise - 07/24/20 0001      Lumbar Exercises: Stretches   Single Knee to Chest Stretch Right;Left;5 reps;10 seconds    Single Knee to Chest Stretch Limitations with strap assist    Lower Trunk Rotation 5 reps    Lower Trunk Rotation Limitations 5-10 second holds bilat.    Pelvic Tilt 15 reps;5 seconds    Piriformis Stretch Left;3 reps;20 seconds    Piriformis Stretch Limitations hold times limited due to soreness    Other Lumbar Stretch Exercise seated trunk flexion AROM x 10 reps brief holds 5 sec      Lumbar Exercises: Aerobic   Nustep L2 x 5 min UE/LE      Modalities   Modalities --   pt. declined modalities     Manual Therapy   Manual Therapy Joint mobilization;Soft tissue  mobilization    Joint Mobilization LAD left hip grade I-III oscillations   therapist grip proximal to ankle due to ankle soreness   Soft tissue mobilization gentle STM left lumbar paraspinal region in right sidelying, gentle foam roll use (pink roll) left posterolateral hip                  PT Education - 07/24/20 1726    Education Details HEP updates, potential symptom etiology, pain education    Person(s) Educated Patient    Methods Explanation;Demonstration;Verbal cues;Handout    Comprehension Verbalized understanding;Returned demonstration            PT Short Term Goals - 07/04/20 0850      PT SHORT TERM GOAL #1   Title Pt will be independent with initial HEP.    Baseline needs multimodal cueing    Time 3    Period Weeks    Status New    Target Date 07/24/20      PT SHORT TERM GOAL #2   Title Pt will review FOTO with PT by 3rd visit    Time 3    Period Weeks    Status New    Target Date  07/24/20             PT Long Term Goals - 07/04/20 0851      PT LONG TERM GOAL #1   Title FOTO goal hear once FOTO performed    Time 6    Period Weeks    Status New    Target Date 08/14/20      PT LONG TERM GOAL #2   Title Pt will be able to sit 30 minutes comfortably without needing to get up and move so that she can rest comfortably.    Baseline 5 minutes before change of position needed    Time 6    Period Weeks    Status New    Target Date 08/14/20      PT LONG TERM GOAL #3   Title Pt will be able to go for 20 minute walks without needing to sit down so that she can increase her physical activity.    Baseline only able to tolerate 10 minute walks now    Time 6    Period Weeks    Status New    Target Date 08/14/20      PT LONG TERM GOAL #4   Title Pt will increase L hip abduction to 3+/5 MMT in order to increase functional strength of the hip and decrease pain    Baseline 2+/5 MMT    Time 6    Period Weeks    Status New    Target Date 08/14/20      PT LONG TERM GOAL #5   Title Pt will achieve 4/5 MMT L hip extension in order to increase functional strength of the hip and decrease pain.    Baseline 3+/5 L hip extension MMT    Time 6    Period Weeks    Status New    Target Date 08/14/20                 Plan - 07/24/20 1728    Clinical Impression Statement As noted at eval some difficulty differentiating potential radicular symptoms vs. myofascial/more generalized pain symptoms and no overt directional preference but did tolerate addition of SKTC, seated flexion and pelvic tilts (trial flexion bias ROM given underlying stenosis). Pt. presented with diffuse muscle tightness/soreness in left  lumbar and posterolateral hip region so included gentle STM with fair tolerance due to tenderness but did note relief in back/hip with long axis hip distraction mobilization modified with therapist grip proximal to ankle due to ankle soreness. Regarding ankle soreness as  noted in subjective pt. able to tolerate LLE weightbearing/no overt antalgia and notes symptoms improving so for now plan to monitor-she sees her PCP next week so advised to alert MD if having continued or furth issues with this.    Personal Factors and Comorbidities Comorbidity 3+;Fitness;Past/Current Experience;Time since onset of injury/illness/exacerbation;Other    Comorbidities PTSD, anxiety, depression, diabetes, GERD, HTN    Examination-Activity Limitations Bed Mobility;Bend;Caring for Others;Carry;Dressing;Lift;Locomotion Level;Sit;Sleep;Squat;Stairs;Stand;Transfers    Examination-Participation Restrictions Cleaning;Community Activity;Driving;Yard Work;Volunteer;Shop;Meal Prep    Stability/Clinical Decision Making Evolving/Moderate complexity    Clinical Decision Making Moderate    Rehab Potential Fair    PT Frequency 1x / week    PT Duration 6 weeks    PT Treatment/Interventions ADLs/Self Care Home Management;Aquatic Therapy;Electrical Stimulation;Cryotherapy;Moist Heat;Stair training;Gait training;Functional mobility training;Therapeutic activities;Therapeutic exercise;Balance training;Patient/family education;Manual techniques;Dry needling;Passive range of motion;Spinal Manipulations;Joint Manipulations    PT Next Visit Plan Check response HEP additions (SKTC, pelvic tilt) for any centralization respone to trial flexion exercises, continue stretches, general mobility/exercise progression, further manual prn, due to myofascial symptoms discussed dry needling but do not plan to incorporate at this time due to needle phobia    PT Home Exercise Plan [id number]    Consulted and Agree with Plan of Care Patient           Patient will benefit from skilled therapeutic intervention in order to improve the following deficits and impairments:  Pain,Improper body mechanics,Postural dysfunction,Decreased mobility,Decreased strength,Hypomobility,Decreased activity tolerance,Decreased endurance,Decreased  range of motion  Visit Diagnosis: Muscle weakness (generalized)  Muscle spasm of back  Pain in left leg  Chronic left-sided low back pain, unspecified whether sciatica present     Problem List There are no problems to display for this patient.   Beaulah Dinning, PT, DPT 07/24/20 5:39 PM  [facility] [address] [place], [place], [address] Phone: [phone]   Fax:  [phone]  Name: Carmen Perkins MRN: [id number] Date of Birth: 08/23/1965

## 2020-07-28 ENCOUNTER — Ambulatory Visit: Payer: No Typology Code available for payment source | Admitting: Physical Therapy

## 2020-07-29 ENCOUNTER — Telehealth: Payer: Self-pay | Admitting: Physical Therapy

## 2020-07-29 ENCOUNTER — Other Ambulatory Visit: Payer: Self-pay

## 2020-07-29 ENCOUNTER — Encounter: Payer: Self-pay | Admitting: Physical Therapy

## 2020-07-29 ENCOUNTER — Ambulatory Visit: Payer: No Typology Code available for payment source | Admitting: Physical Therapy

## 2020-07-29 DIAGNOSIS — M62830 Muscle spasm of back: Secondary | ICD-10-CM

## 2020-07-29 DIAGNOSIS — M6281 Muscle weakness (generalized): Secondary | ICD-10-CM

## 2020-07-29 DIAGNOSIS — M545 Low back pain, unspecified: Secondary | ICD-10-CM

## 2020-07-29 DIAGNOSIS — M79605 Pain in left leg: Secondary | ICD-10-CM

## 2020-07-29 NOTE — Telephone Encounter (Signed)
Attempted to contact patient due to missed PT appointment on 07/28/2020. Left VM informing patient of missed appointment, and reminding her of attendance policy. Patient informed of next scheduled appointment and to call if she needs to cancel or reschedule.  Hilda Blades, PT, DPT, LAT, ATC 07/29/20  9:24 AM Phone: [phone] Fax: [phone]

## 2020-07-29 NOTE — Therapy (Signed)
[place], [place], [address] Phone: [phone]   Fax:  [phone]  Physical Therapy Treatment  Patient Details  Name: Carmen Perkins MRN: [id number] Date of Birth: July 08, 1965 Referring Provider (PT): Earnie Larsson, MD   Encounter Date: 07/29/2020   PT End of Session - 07/29/20 1643    Visit Number 3    Number of Visits 6    Date for PT Re-Evaluation 08/14/20    Authorization Type [facility] [employer] [facility]/Medicare    Authorization Time Period 05/25/20-11/21/20    Authorization - Visit Number 3    Authorization - Number of Visits 15    PT Start Time 1638   arrived a few minutes late   PT Stop Time 1717    PT Time Calculation (min) 39 min           Past Medical History:  Diagnosis Date  . Anxiety   . Bronchitis   . Depression   . Diabetes ([place])   . Environmental allergies   . Environmental allergies   . GERD (gastroesophageal reflux disease)   . Glaucoma   . Hypertension   . PTSD (post-traumatic stress disorder)     Past Surgical History:  Procedure Laterality Date  . ABDOMINAL HYSTERECTOMY      There were no vitals filed for this visit.   Subjective Assessment - 07/29/20 1640    Subjective Back felt a little better after last session but was exacerbated yesterday-she reports had an emergency with her dog where she had to abruptly reach down to stop her dog from eating some ant poison. Left ankle also doing a little better/less swollen but plans ot have PCP assess at follow up later this week. For leg symptoms she is still having LLE radiating pain but notes mild ease of this with exercises and medication.    Currently in Pain? Yes    Pain Score 6     Pain Location Back    Pain Orientation Left    Pain Descriptors / Indicators Dull;Sharp    Pain Type Chronic pain    Pain Radiating Towards left leg    Pain Onset More than a month ago    Pain Frequency Intermittent    Aggravating Factors   prolonged standing, chores    Pain Relieving Factors rest and medication    Effect of Pain on Daily Activities limits activity and positional tolerance              [facility] PT Assessment - 07/29/20 0001      Strength   Left Hip ABduction 3+/5                         [facility] Adult PT Treatment/Exercise - 07/29/20 0001      Lumbar Exercises: Stretches   Passive Hamstring Stretch Left;3 reps;30 seconds    Single Knee to Chest Stretch Limitations x10 reps ea. bilat. with strap 5 sec holds    Lower Trunk Rotation Limitations 10 reps x 5-10 sec holds x 10 reps right side emphasis for left side lumbar stretch    Pelvic Tilt 15 reps;5 seconds    Piriformis Stretch Left;3 reps;30 seconds    Other Lumbar Stretch Exercise seated trunk flexion with P-ball roll out using 85 cm ball x 15 reps    Other Lumbar Stretch Exercise sidelying manual left QL stretch 3x30 sec      Lumbar Exercises: Aerobic   Nustep L3 x  5 min UE/LE      Manual Therapy   Joint Mobilization LAD left hip grade I-III oscillations   therapist grip proximal to ankle due to ankle soreness   Soft tissue mobilization STM left lumbar paraspinal region in right sidelying                    PT Short Term Goals - 07/04/20 0850      PT SHORT TERM GOAL #1   Title Pt will be independent with initial HEP.    Baseline needs multimodal cueing    Time 3    Period Weeks    Status New    Target Date 07/24/20      PT SHORT TERM GOAL #2   Title Pt will review FOTO with PT by 3rd visit    Time 3    Period Weeks    Status New    Target Date 07/24/20             PT Long Term Goals - 07/04/20 0851      PT LONG TERM GOAL #1   Title FOTO goal hear once FOTO performed    Time 6    Period Weeks    Status New    Target Date 08/14/20      PT LONG TERM GOAL #2   Title Pt will be able to sit 30 minutes comfortably without needing to get up and move so that she can rest comfortably.    Baseline 5 minutes  before change of position needed    Time 6    Period Weeks    Status New    Target Date 08/14/20      PT LONG TERM GOAL #3   Title Pt will be able to go for 20 minute walks without needing to sit down so that she can increase her physical activity.    Baseline only able to tolerate 10 minute walks now    Time 6    Period Weeks    Status New    Target Date 08/14/20      PT LONG TERM GOAL #4   Title Pt will increase L hip abduction to 3+/5 MMT in order to increase functional strength of the hip and decrease pain    Baseline 2+/5 MMT    Time 6    Period Weeks    Status New    Target Date 08/14/20      PT LONG TERM GOAL #5   Title Pt will achieve 4/5 MMT L hip extension in order to increase functional strength of the hip and decrease pain.    Baseline 3+/5 L hip extension MMT    Time 6    Period Weeks    Status New    Target Date 08/14/20                 Plan - 07/29/20 1713    Clinical Impression Statement Still with moderate to high pain level but showing some improvement from baseline status for decreased pain and LLE radiaitng symptoms/responding to flexion bias ROM and manua tx. and also with gains for hip abduction strength. More tolerance today for pressure with STM to left paraspinal region as well.    Personal Factors and Comorbidities Comorbidity 3+;Fitness;Past/Current Experience;Time since onset of injury/illness/exacerbation;Other    Comorbidities PTSD, anxiety, depression, diabetes, GERD, HTN    Examination-Activity Limitations Bed Mobility;Bend;Caring for Others;Carry;Dressing;Lift;Locomotion Level;Sit;Sleep;Squat;Stairs;Stand;Transfers    Examination-Participation Restrictions Cleaning;Community Activity;Driving;Yard Work;Volunteer;Shop;Meal Prep  Stability/Clinical Decision Making Evolving/Moderate complexity    Clinical Decision Making Moderate    Rehab Potential Fair    PT Frequency 1x / week    PT Duration 6 weeks    PT Treatment/Interventions  ADLs/Self Care Home Management;Aquatic Therapy;Electrical Stimulation;Cryotherapy;Moist Heat;Stair training;Gait training;Functional mobility training;Therapeutic activities;Therapeutic exercise;Balance training;Patient/family education;Manual techniques;Dry needling;Passive range of motion;Spinal Manipulations;Joint Manipulations    PT Next Visit Plan Continue flexion bias ROM, progress exercises as tolerated, STM/manual as needed    PT Home Exercise Plan [email or web address]    Consulted and Agree with Plan of Care Patient           Patient will benefit from skilled therapeutic intervention in order to improve the following deficits and impairments:  Pain,Improper body mechanics,Postural dysfunction,Decreased mobility,Decreased strength,Hypomobility,Decreased activity tolerance,Decreased endurance,Decreased range of motion  Visit Diagnosis: Chronic left-sided low back pain, unspecified whether sciatica present  Pain in left leg  Muscle spasm of back  Muscle weakness (generalized)     Problem List There are no problems to display for this patient.   Beaulah Dinning, PT, DPT 07/29/20 5:42 PM  [facility] [address] [place], [place], [address] Phone: [phone]   Fax:  [phone]  Name: Carmen Perkins MRN: [id number] Date of Birth: [birth year]

## 2020-08-07 ENCOUNTER — Ambulatory Visit: Payer: No Typology Code available for payment source | Admitting: Physical Therapy

## 2020-08-12 ENCOUNTER — Encounter: Payer: Self-pay | Admitting: Physical Therapy

## 2020-08-12 ENCOUNTER — Ambulatory Visit: Payer: No Typology Code available for payment source | Admitting: Physical Therapy

## 2020-08-12 ENCOUNTER — Other Ambulatory Visit: Payer: Self-pay

## 2020-08-12 DIAGNOSIS — M545 Low back pain, unspecified: Secondary | ICD-10-CM

## 2020-08-12 DIAGNOSIS — M62830 Muscle spasm of back: Secondary | ICD-10-CM

## 2020-08-12 DIAGNOSIS — M6281 Muscle weakness (generalized): Secondary | ICD-10-CM | POA: Diagnosis not present

## 2020-08-12 DIAGNOSIS — M79605 Pain in left leg: Secondary | ICD-10-CM

## 2020-08-12 NOTE — Therapy (Signed)
[place], [place], [address] Phone: [phone]   Fax:  [phone]  Physical Therapy Treatment  Patient Details  Name: Carmen Perkins MRN: [id number] Date of Birth: [birth year] Referring Provider (PT): Earnie Larsson, MD   Encounter Date: 08/12/2020   PT End of Session - 08/12/20 1704    Visit Number 4    Number of Visits 6    Date for PT Re-Evaluation 08/14/20    Authorization Type [employer] [facility]/Medicare    Authorization Time Period 05/25/20-11/21/20    Authorization - Visit Number 4    Authorization - Number of Visits 15    PT Start Time 1632    PT Stop Time 1713    PT Time Calculation (min) 41 min    Activity Tolerance Patient tolerated treatment well    Behavior During Therapy [facility] for tasks assessed/performed           Past Medical History:  Diagnosis Date  . Anxiety   . Bronchitis   . Depression   . Diabetes ([place])   . Environmental allergies   . Environmental allergies   . GERD (gastroesophageal reflux disease)   . Glaucoma   . Hypertension   . PTSD (post-traumatic stress disorder)     Past Surgical History:  Procedure Laterality Date  . ABDOMINAL HYSTERECTOMY      There were no vitals filed for this visit.   Subjective Assessment - 08/12/20 1635    Subjective Pt. reports back feeling much better than with previous status. Pain 3/10 this PM locally to left lumbar region-no LLE radiating pain for the past week.    Currently in Pain? Yes    Pain Score 3     Pain Location Back    Pain Orientation Left    Pain Descriptors / Indicators Dull    Pain Type Chronic pain    Pain Onset More than a month ago    Pain Frequency Intermittent    Aggravating Factors  prolonged standing, chores    Pain Relieving Factors rest and medication    Effect of Pain on Daily Activities limits activity and positional tolerance                             [facility] Adult PT  Treatment/Exercise - 08/12/20 0001      Lumbar Exercises: Stretches   Double Knee to Chest Stretch Limitations 2x10 with legs on 55 cm P-ball    Lower Trunk Rotation Limitations 10 reps x 5-10 sec holds x 10 reps right side emphasis for left side lumbar stretch   legs on 55 cm P-ball     Lumbar Exercises: Aerobic   Nustep L4 x 5 min UE/LE      Lumbar Exercises: Standing   Functional Squats 20 reps    Functional Squats Limitations partial squat at counter 2x10    Row AROM;Strengthening;Both;20 reps    Theraband Level (Row) Level 4 (Blue)    Shoulder Extension AROM;Strengthening;Both;20 reps    Theraband Level (Shoulder Extension) Level 4 (Blue)      Lumbar Exercises: Supine   Pelvic Tilt 15 reps;5 seconds    Clam 20 reps    Clam Limitations green band    Dead Bug 15 reps    Bridge with clamshell 15 reps   green band   Other Supine Lumbar Exercises hip adduction isometric with ball 5 sec x 15 reps  Manual Therapy   Joint Mobilization LAD left hip grade I-III oscillations   therapist grip proximal to ankle due to ankle soreness, held after 3-4 minutes due to increased back soreness   Soft tissue mobilization STM left lumbar paraspinal region in right sidelying                    PT Short Term Goals - 07/04/20 0850      PT SHORT TERM GOAL #1   Title Pt will be independent with initial HEP.    Baseline needs multimodal cueing    Time 3    Period Weeks    Status New    Target Date 07/24/20      PT SHORT TERM GOAL #2   Title Pt will review FOTO with PT by 3rd visit    Time 3    Period Weeks    Status New    Target Date 07/24/20             PT Long Term Goals - 07/04/20 0851      PT LONG TERM GOAL #1   Title FOTO goal hear once FOTO performed    Time 6    Period Weeks    Status New    Target Date 08/14/20      PT LONG TERM GOAL #2   Title Pt will be able to sit 30 minutes comfortably without needing to get up and move so that she can rest  comfortably.    Baseline 5 minutes before change of position needed    Time 6    Period Weeks    Status New    Target Date 08/14/20      PT LONG TERM GOAL #3   Title Pt will be able to go for 20 minute walks without needing to sit down so that she can increase her physical activity.    Baseline only able to tolerate 10 minute walks now    Time 6    Period Weeks    Status New    Target Date 08/14/20      PT LONG TERM GOAL #4   Title Pt will increase L hip abduction to 3+/5 MMT in order to increase functional strength of the hip and decrease pain    Baseline 2+/5 MMT    Time 6    Period Weeks    Status New    Target Date 08/14/20      PT LONG TERM GOAL #5   Title Pt will achieve 4/5 MMT L hip extension in order to increase functional strength of the hip and decrease pain.    Baseline 3+/5 L hip extension MMT    Time 6    Period Weeks    Status New    Target Date 08/14/20                 Plan - 08/12/20 1701    Clinical Impression Statement Pt. improving with decreased pain intensity and recent improvement of left LE radiating symptoms. More focus today on core strengthening progression with overall good tolerance/minimal symptoms. Also continued previous manual tx. but limited performance of left hip LAD due to temporary increase in lumbar pain symptoms. Otherwise/overall progressing well with therapy goals with improvement from baseline status.    Personal Factors and Comorbidities Comorbidity 3+;Fitness;Past/Current Experience;Time since onset of injury/illness/exacerbation;Other    Comorbidities PTSD, anxiety, depression, diabetes, GERD, HTN    Examination-Activity Limitations Bed Mobility;Bend;Caring for Others;Carry;Dressing;Lift;Locomotion Level;Sit;Sleep;Squat;Stairs;Stand;Transfers  Examination-Participation Restrictions Cleaning;Community Activity;Driving;Yard Work;Volunteer;Shop;Meal Prep    Stability/Clinical Decision Making Evolving/Moderate complexity     Clinical Decision Making Moderate    Rehab Potential Fair    PT Frequency 1x / week    PT Duration 6 weeks    PT Treatment/Interventions ADLs/Self Care Home Management;Aquatic Therapy;Electrical Stimulation;Cryotherapy;Moist Heat;Stair training;Gait training;Functional mobility training;Therapeutic activities;Therapeutic exercise;Balance training;Patient/family education;Manual techniques;Dry needling;Passive range of motion;Spinal Manipulations;Joint Manipulations    PT Next Visit Plan Continue flexion bias ROM, continue to progress core and lumbar strengthening/stabilization as tolerated, recert due next session    PT [place] and Agree with Plan of Care Patient           Patient will benefit from skilled therapeutic intervention in order to improve the following deficits and impairments:  Pain,Improper body mechanics,Postural dysfunction,Decreased mobility,Decreased strength,Hypomobility,Decreased activity tolerance,Decreased endurance,Decreased range of motion  Visit Diagnosis: Chronic left-sided low back pain, unspecified whether sciatica present  Pain in left leg  Muscle spasm of back  Muscle weakness (generalized)     Problem List There are no problems to display for this patient.   Beaulah Dinning, PT, DPT 08/12/20 9:22 PM  [facility] [address] [place], [place], [address] Phone: [phone]   Fax:  [phone]  Name: Carmen Perkins MRN: [id number] Date of Birth: [birth year]

## 2020-08-19 ENCOUNTER — Encounter: Payer: No Typology Code available for payment source | Admitting: Physical Therapy

## 2020-08-26 ENCOUNTER — Other Ambulatory Visit: Payer: Self-pay

## 2020-08-26 ENCOUNTER — Ambulatory Visit: Payer: No Typology Code available for payment source | Attending: Neurosurgery | Admitting: Physical Therapy

## 2020-08-26 DIAGNOSIS — M6281 Muscle weakness (generalized): Secondary | ICD-10-CM | POA: Insufficient documentation

## 2020-08-26 DIAGNOSIS — M79605 Pain in left leg: Secondary | ICD-10-CM | POA: Insufficient documentation

## 2020-08-26 DIAGNOSIS — M545 Low back pain, unspecified: Secondary | ICD-10-CM | POA: Diagnosis present

## 2020-08-26 DIAGNOSIS — M62830 Muscle spasm of back: Secondary | ICD-10-CM | POA: Diagnosis present

## 2020-08-26 DIAGNOSIS — G8929 Other chronic pain: Secondary | ICD-10-CM | POA: Insufficient documentation

## 2020-08-27 NOTE — Therapy (Signed)
[place], [place], [address] Phone: [phone]   Fax:  [phone]  Physical Therapy Treatment/Discharge  Patient Details  Name: Carmen Perkins MRN: [id number] Date of Birth: 11-15-1965 Referring Provider (PT): Earnie Larsson, MD   Encounter Date: 08/26/2020   PT End of Session - 08/26/20 1636    Visit Number 5    Number of Visits 5    Date for PT Re-Evaluation 08/26/20    Authorization Type [facility]/Medicare    Authorization Time Period 05/25/20-11/21/20    Authorization - Visit Number 5    Authorization - Number of Visits 15    PT Start Time 9937    PT Stop Time 1696    PT Time Calculation (min) 39 min    Activity Tolerance Patient tolerated treatment well    Behavior During Therapy [facility] for tasks assessed/performed           Past Medical History:  Diagnosis Date  . Anxiety   . Bronchitis   . Depression   . Diabetes ([place])   . Environmental allergies   . Environmental allergies   . GERD (gastroesophageal reflux disease)   . Glaucoma   . Hypertension   . PTSD (post-traumatic stress disorder)     Past Surgical History:  Procedure Laterality Date  . ABDOMINAL HYSTERECTOMY      There were no vitals filed for this visit.   Subjective Assessment - 08/26/20 1637    Subjective Pt. reports back pain significantly improved from baseline status-rates improvement at 98% from baseline and with pain 1/10 this PM. Discussed status and pt. reports she is ready for d/c from formal therapy-she will continue via HEP and follow up with MD in the future with any changes in status/if needing any further formal therapy.    Pertinent History PTSD              [facility] PT Assessment - 08/27/20 0001      AROM   Lumbar Flexion 95 deg/able to touch toes    Lumbar Extension 20 deg    Lumbar - Right Side Bend 18    Lumbar - Left Side Bend 20    Lumbar - Right Rotation WNL    Lumbar - Left  Rotation WNL      Strength   Right Hip Flexion 4+/5    Right Hip Extension 4+/5    Right Hip ABduction 4/5    Left Hip Flexion 4/5    Left Hip Extension 4/5    Left Hip ABduction 4-/5    Right Knee Flexion 5/5    Right Knee Extension 5/5    Left Knee Flexion 5/5    Left Knee Extension 5/5                         [facility] Adult PT Treatment/Exercise - 08/27/20 0001      Lumbar Exercises: Stretches   Single Knee to Chest Stretch Limitations brief HEP review      Lumbar Exercises: Standing   Functional Squats 15 reps      Lumbar Exercises: Supine   Bent Knee Raise 20 reps    Bridge 20 reps    Isometric Hip Flexion 10 reps;3 seconds      Manual Therapy   Soft tissue mobilization STM left lumbar paraspinal region in right sidelying                  PT  Education - 08/27/20 1040    Education Details HEP updates, POC    Person(s) Educated Patient    Methods Explanation;Demonstration;Verbal cues;Handout    Comprehension Verbalized understanding;Returned demonstration            PT Short Term Goals - 08/27/20 1043      PT SHORT TERM GOAL #1   Title Pt will be independent with initial HEP.    Baseline met    Time 3    Period Weeks    Status Achieved    Target Date 07/24/20      PT SHORT TERM GOAL #2   Title Pt will review FOTO with PT by 3rd visit    Baseline deferred-FOTO not assessed    Time 3    Period Weeks    Status Deferred    Target Date 07/24/20             PT Long Term Goals - 08/27/20 1043      PT LONG TERM GOAL #1   Title FOTO goal here once FOTO performed    Baseline deferred-FOTO not assessed    Time 6    Period Weeks    Status Deferred      PT LONG TERM GOAL #2   Title Pt will be able to sit 30 minutes comfortably without needing to get up and move so that she can rest comfortably.    Baseline met    Time 6    Period Weeks    Status Achieved      PT LONG TERM GOAL #3   Title Pt will be able to go for 20 minute  walks without needing to sit down so that she can increase her physical activity.    Baseline met    Time 6    Period Weeks    Status Achieved      PT LONG TERM GOAL #4   Title Pt will increase L hip abduction to 3+/5 MMT in order to increase functional strength of the hip and decrease pain    Baseline met    Time 6    Period Weeks    Status Achieved      PT LONG TERM GOAL #5   Title Pt will achieve 4/5 MMT L hip extension in order to increase functional strength of the hip and decrease pain.    Baseline met    Time 6    Period Weeks    Status Achieved                 Plan - 08/26/20 1649    Clinical Impression Statement Pt. has progressed well with therapy with minimal current symptoms and therapy goals met. At this point hopeful she can continue progress independently with HEP so plan d/c formal therapy and recommend MD follow up if having any future changes in status.    Personal Factors and Comorbidities Comorbidity 3+;Fitness;Past/Current Experience;Time since onset of injury/illness/exacerbation;Other    Comorbidities PTSD, anxiety, depression, diabetes, GERD, HTN    Examination-Activity Limitations Bed Mobility;Bend;Caring for Others;Carry;Dressing;Lift;Locomotion Level;Sit;Sleep;Squat;Stairs;Stand;Transfers    Examination-Participation Restrictions Cleaning;Community Activity;Driving;Yard Work;Volunteer;Shop;Meal Prep    Stability/Clinical Decision Making Stable/Uncomplicated    Clinical Decision Making Moderate    Rehab Potential Good    PT Frequency --   d/c to HEP today   PT Treatment/Interventions ADLs/Self Care Home Management;Aquatic Therapy;Electrical Stimulation;Cryotherapy;Moist Heat;Stair training;Gait training;Functional mobility training;Therapeutic activities;Therapeutic exercise;Balance training;Patient/family education;Manual techniques;Dry needling;Passive range of motion;Spinal Manipulations;Joint Manipulations    PT Next Visit Plan  NA    PT Home  Exercise Plan [id number]    Consulted and Agree with Plan of Care Patient           Patient will benefit from skilled therapeutic intervention in order to improve the following deficits and impairments:  Pain,Improper body mechanics,Postural dysfunction,Decreased mobility,Decreased strength,Hypomobility,Decreased activity tolerance,Decreased endurance,Decreased range of motion  Visit Diagnosis: Chronic left-sided low back pain, unspecified whether sciatica present  Pain in left leg  Muscle spasm of back  Muscle weakness (generalized)     Problem List There are no problems to display for this patient.      PHYSICAL THERAPY DISCHARGE SUMMARY  Visits from Start of Care: 5  Current functional level related to goals / functional outcomes: See above-therapy goals met   Remaining deficits: Mild hip weakness   Education / Equipment: HEP  Plan: Patient agrees to discharge.  Patient goals were met. Patient is being discharged due to meeting the stated rehab goals.  ?????        Beaulah Dinning, PT, DPT 08/27/20 10:46 AM       [facility] [address] [place], [place], [address] Phone: [phone]   Fax:  [phone]  Name: Carmen Perkins MRN: [id number] Date of Birth: [birth year]

## 2020-09-08 ENCOUNTER — Encounter: Payer: No Typology Code available for payment source | Admitting: Physical Therapy

## 2020-09-22 ENCOUNTER — Encounter: Payer: No Typology Code available for payment source | Admitting: Physical Therapy

## 2020-12-16 ENCOUNTER — Other Ambulatory Visit: Payer: Self-pay | Admitting: Family Medicine

## 2020-12-16 DIAGNOSIS — Z1231 Encounter for screening mammogram for malignant neoplasm of breast: Secondary | ICD-10-CM

## 2021-01-19 ENCOUNTER — Encounter: Payer: Self-pay | Admitting: Gastroenterology

## 2021-02-05 ENCOUNTER — Ambulatory Visit: Payer: No Typology Code available for payment source

## 2021-02-13 ENCOUNTER — Ambulatory Visit: Payer: No Typology Code available for payment source

## 2021-03-23 ENCOUNTER — Ambulatory Visit: Payer: No Typology Code available for payment source

## 2021-04-02 ENCOUNTER — Ambulatory Visit: Payer: No Typology Code available for payment source

## 2021-05-06 ENCOUNTER — Ambulatory Visit: Payer: No Typology Code available for payment source

## 2021-06-04 ENCOUNTER — Ambulatory Visit
Admission: RE | Admit: 2021-06-04 | Discharge: 2021-06-04 | Disposition: A | Discharge status: Home / Self Care | Payer: Medicare Other | Source: Ambulatory Visit | Attending: Family Medicine | Admitting: Family Medicine

## 2021-06-04 DIAGNOSIS — Z1231 Encounter for screening mammogram for malignant neoplasm of breast: Secondary | ICD-10-CM

## 2021-09-10 ENCOUNTER — Other Ambulatory Visit: Payer: Self-pay | Admitting: Internal Medicine

## 2021-09-10 ENCOUNTER — Ambulatory Visit
Admission: RE | Admit: 2021-09-10 | Discharge: 2021-09-10 | Disposition: A | Discharge status: Home / Self Care | Payer: Medicare Other | Source: Ambulatory Visit | Attending: Internal Medicine | Admitting: Internal Medicine

## 2021-09-10 DIAGNOSIS — R062 Wheezing: Secondary | ICD-10-CM

## 2021-09-27 ENCOUNTER — Ambulatory Visit
Admission: EM | Admit: 2021-09-27 | Discharge: 2021-09-27 | Disposition: A | Discharge status: Other Acute Inpatient Hospital | Payer: Medicare Other | Attending: Urgent Care | Admitting: Urgent Care

## 2021-09-27 ENCOUNTER — Other Ambulatory Visit: Payer: Self-pay

## 2021-09-27 ENCOUNTER — Encounter: Payer: Self-pay | Admitting: Emergency Medicine

## 2021-09-27 ENCOUNTER — Encounter (HOSPITAL_BASED_OUTPATIENT_CLINIC_OR_DEPARTMENT_OTHER): Payer: Self-pay | Admitting: Emergency Medicine

## 2021-09-27 ENCOUNTER — Emergency Department (HOSPITAL_BASED_OUTPATIENT_CLINIC_OR_DEPARTMENT_OTHER)
Admission: EM | Admit: 2021-09-27 | Discharge: 2021-09-27 | Disposition: A | Discharge status: Home / Self Care | Payer: No Typology Code available for payment source | Attending: Emergency Medicine | Admitting: Emergency Medicine

## 2021-09-27 DIAGNOSIS — R739 Hyperglycemia, unspecified: Secondary | ICD-10-CM | POA: Insufficient documentation

## 2021-09-27 DIAGNOSIS — I10 Essential (primary) hypertension: Secondary | ICD-10-CM | POA: Insufficient documentation

## 2021-09-27 DIAGNOSIS — R42 Dizziness and giddiness: Secondary | ICD-10-CM

## 2021-09-27 DIAGNOSIS — Z79899 Other long term (current) drug therapy: Secondary | ICD-10-CM | POA: Insufficient documentation

## 2021-09-27 DIAGNOSIS — R002 Palpitations: Secondary | ICD-10-CM

## 2021-09-27 LAB — COMPREHENSIVE METABOLIC PANEL
ALT: 31 U/L (ref 0–44)
AST: 18 U/L (ref 15–41)
Albumin: 4 g/dL (ref 3.5–5.0)
Alkaline Phosphatase: 104 U/L (ref 38–126)
Anion gap: 6 (ref 5–15)
BUN: 15 mg/dL (ref 6–20)
CO2: 29 mmol/L (ref 22–32)
Calcium: 9.6 mg/dL (ref 8.9–10.3)
Chloride: 103 mmol/L (ref 98–111)
Creatinine, Ser: 0.72 mg/dL (ref 0.44–1.00)
GFR, Estimated: >60 mL/min (ref >60)
Glucose, Bld: 239 mg/dL — ABNORMAL HIGH (ref 70–99)
Potassium: 3.5 mmol/L (ref 3.5–5.1)
Sodium: 138 mmol/L (ref 135–145)
Total Bilirubin: 0.8 mg/dL (ref 0.3–1.2)
Total Protein: 7.2 g/dL (ref 6.5–8.1)

## 2021-09-27 LAB — CBC WITH DIFFERENTIAL/PLATELET
Abs Immature Granulocytes: 0.01 10*3/uL (ref 0.00–0.07)
Basophils Absolute: 0 10*3/uL (ref 0.0–0.1)
Basophils Relative: 0 %
Eosinophils Absolute: 0.2 10*3/uL (ref 0.0–0.5)
Eosinophils Relative: 4 %
HCT: 39.5 % (ref 36.0–46.0)
Hemoglobin: 13 g/dL (ref 12.0–15.0)
Immature Granulocytes: 0 %
Lymphocytes Relative: 31 %
Lymphs Abs: 1.9 10*3/uL (ref 0.7–4.0)
MCH: 28.8 pg (ref 26.0–34.0)
MCHC: 32.9 g/dL (ref 30.0–36.0)
MCV: 87.6 fL (ref 80.0–100.0)
Monocytes Absolute: 0.3 10*3/uL (ref 0.1–1.0)
Monocytes Relative: 5 %
Neutro Abs: 3.6 10*3/uL (ref 1.7–7.7)
Neutrophils Relative %: 60 %
Platelets: 270 10*3/uL (ref 150–400)
RBC: 4.51 MIL/uL (ref 3.87–5.11)
RDW: 13.7 % (ref 11.5–15.5)
WBC: 6.1 10*3/uL (ref 4.0–10.5)
nRBC: 0 % (ref 0.0–0.2)

## 2021-09-27 MED ORDER — METFORMIN HCL 500 MG PO TABS: 500.0000 mg | ORAL_TABLET | Freq: Two times a day (BID) | ORAL | 0 refills | Status: DC

## 2021-09-27 NOTE — ED Provider Notes (Signed)
?[place] EMERGENCY DEPARTMENT ?Provider Note ? ? ?CSN: [id number] ?Arrival date & time: 09/27/21  1228 ? ?  ? ?History ? ?Chief Complaint  ?Patient presents with  ? Palpitations  ? ? ?Carmen Perkins is a 56 y.o. female. ? ?Patient sent here from urgent care to check blood work.  Having palpitations intermittent dizziness.  Asymptomatic now.  Is on potassium supplements and concern for low potassium.  Denies any chest pain, shortness of breath, abdominal pain.  Nothing makes it worse or better.  History of hypertension, depression, anxiety. ? ? ? ?  ? ?Home Medications ?Prior to Admission medications   ?Medication Sig Start Date End Date Taking? Authorizing Provider  ?albuterol (PROVENTIL HFA;VENTOLIN HFA) 108 (90 BASE) MCG/ACT inhaler Inhale 2 puffs into the lungs every 4 (four) hours as needed. For wheezing or shortness of breath.    [provider]  ?amLODipine (NORVASC) 10 MG tablet Take 10 mg by mouth daily.    [provider]  ?bisacodyl (BISACODYL) 5 MG EC tablet Take 1 tablet (5 mg total) by mouth once. ?Patient not taking: Reported on 07/03/2020 09/12/15   Milus Banister, MD  ?Calcium Carbonate-Vitamin D (CALCIUM 600 + D PO) Take 1 tablet by mouth 2 (two) times daily.    [provider]  ?carvedilol (COREG) 25 MG tablet Take 25 mg by mouth 2 (two) times daily. ?Patient not taking: Reported on 07/03/2020 12/06/15   [provider]  ?cetirizine (ZYRTEC) 10 MG tablet Take 10 mg by mouth daily.    [provider]  ?diclofenac (VOLTAREN) 75 MG EC tablet Take 75 mg by mouth 2 (two) times daily as needed.    [provider]  ?hydrochlorothiazide (HYDRODIURIL) 25 MG tablet Take 25 mg by mouth daily.    [provider]  ?hydrOXYzine (ATARAX/VISTARIL) 10 MG tablet Take 50 mg by mouth 2 (two) times daily as needed.    [provider]  ?ibuprofen (ADVIL,MOTRIN) 800 MG tablet Take 800 mg by mouth every 8 (eight) hours as needed. For back  pain.    [provider]  ?losartan (COZAAR) 100 MG tablet Take 100 mg by mouth daily. 02/28/15   [provider]  ?metFORMIN (GLUCOPHAGE) 500 MG tablet Take 1 tablet (500 mg total) by mouth 2 (two) times daily with a meal. 09/27/21 10/27/21  Searra Carnathan, DO  ?metoprolol tartrate (LOPRESSOR) 25 MG tablet Take 25 mg by mouth 2 (two) times daily. ?Patient not taking: Reported on 07/03/2020    [provider]  ?Multiple Vitamin (MULTIVITAMIN WITH MINERALS) TABS tablet Take 1 tablet by mouth daily.    [provider]  ?PARoxetine (PAXIL) 40 MG tablet Take 60 mg by mouth daily. ?Patient not taking: Reported on 07/03/2020    [provider]  ?polyvinyl alcohol (LIQUIFILM TEARS) 1.4 % ophthalmic solution Place 1 drop into both eyes as needed. For dry eyes.    [provider]  ?potassium chloride SA (K-DUR,KLOR-CON) 20 MEQ tablet Take 20 mEq by mouth 2 (two) times daily.    [provider]  ?sennosides-docusate sodium (SENOKOT-S) 8.6-50 MG tablet Take 2 tablets by mouth 2 (two) times daily.    [provider]  ?vitamin C (ASCORBIC ACID) 250 MG tablet Take 500 mg by mouth daily.    [provider]  ?   ? ?Allergies    ?Atenolol and Lisinopril   ? ?Review of Systems   ?Review of Systems ? ?Physical Exam ?Updated Vital Signs ?BP (!) 140/92  Pulse 85   Temp 98 ?F (36.7 ?C) (Oral)   Resp 18   Ht '5\' 4"'$  (1.626 m)   Wt 86.2 kg   SpO2 97%   BMI 32.61 kg/m?  ?Physical Exam ?Vitals and nursing note reviewed.  ?Constitutional:   ?   General: She is not in acute distress. ?   Appearance: She is well-developed. She is not ill-appearing.  ?HENT:  ?   Head: Normocephalic and atraumatic.  ?Eyes:  ?   Extraocular Movements: Extraocular movements intact.  ?   Conjunctiva/sclera: Conjunctivae normal.  ?   Pupils: Pupils are equal, round, and reactive to light.  ?Cardiovascular:  ?   Rate and Rhythm: Normal rate and regular rhythm.  ?   Pulses: Normal  pulses.  ?   Heart sounds: Normal heart sounds. No murmur heard. ?Pulmonary:  ?   Effort: Pulmonary effort is normal. No respiratory distress.  ?   Breath sounds: Normal breath sounds.  ?Abdominal:  ?   Palpations: Abdomen is soft.  ?   Tenderness: There is no abdominal tenderness.  ?Musculoskeletal:     ?   General: No swelling.  ?   Cervical back: Neck supple.  ?Skin: ?   General: Skin is warm and dry.  ?   Capillary Refill: Capillary refill takes less than 2 seconds.  ?Neurological:  ?   General: No focal deficit present.  ?   Mental Status: She is alert and oriented to person, place, and time.  ?   Cranial Nerves: No cranial nerve deficit.  ?   Sensory: No sensory deficit.  ?   Motor: No weakness.  ?   Coordination: Coordination normal.  ?   Comments: 5+ out of 5 strength throughout, normal sensation, no drift, normal finger-nose-finger, normal speech  ?Psychiatric:     ?   Mood and Affect: Mood normal.  ? ? ?ED Results / Procedures / Treatments   ?Labs ?(all labs ordered are listed, but only abnormal results are displayed) ?Labs Reviewed  ?COMPREHENSIVE METABOLIC PANEL - Abnormal; Notable for the following components:  ?    Result Value  ? Glucose, Bld 239 (*)   ? All other components within normal limits  ?CBC WITH DIFFERENTIAL/PLATELET  ? ? ?EKG ?EKG Interpretation ? ?Date/Time:  Sunday Sep 27 2021 12:40:20 EDT ?Ventricular Rate:  87 ?PR Interval:  162 ?QRS Duration: 78 ?QT Interval:  342 ?QTC Calculation: 411 ?R Axis:   69 ?Text Interpretation: Normal sinus rhythm When compared with ECG of 10-May-2015 12:15, PREVIOUS ECG IS PRESENT Confirmed by Lennice Sites [phone]) on 09/27/2021 1:18:23 PM ? ?Radiology ?No results found. ? ?Procedures ?Procedures  ? ? ?Medications Ordered in ED ?Medications - No data to display ? ?ED Course/ Medical Decision Making/ A&P ?  ?                        ?Medical Decision Making ?Amount and/or Complexity of Data Reviewed ?Labs: ordered. ? ? ?Carmen Perkins is here with palpitations.   Sent from urgent care to check electrolytes.  Normal vitals.  No fever.  EKG shows sinus rhythm per my review and interpretation.  No ischemic changes.  Lab work done including CBC and CMP shows blood sugar of 239 but per my further review and interpretation there is no significant anemia, electrolyte abnormality, kidney injury otherwise.  She is asymptomatic.  Normal neurological exam.  Have no concern for stroke or acute cardiac or pulmonary process.  Her  symptoms are nonspecific.  Intermittent palpitations.  Likely PACs or PVCs.  Does appear that she has diabetes.  We will start her on metformin and have her follow-up with her primary care doctor.  Discharged in good condition. ? ?This chart was dictated using voice recognition software.  Despite best efforts to proofread,  errors can occur which can change the documentation meaning.  ? ? ? ? ? ? ? ?Final Clinical Impression(s) / ED Diagnoses ?Final diagnoses:  ?Palpitations  ?Hyperglycemia  ? ? ?Rx / DC Orders ?ED Discharge Orders   ? ?      Ordered  ?  metFORMIN (GLUCOPHAGE) 500 MG tablet  2 times daily with meals       ? 09/27/21 1403  ? ?  ?  ? ?  ? ? ?  ?Lennice Sites, DO ?09/27/21 1403 ? ?

## 2021-09-27 NOTE — ED Notes (Signed)
Patient is being discharged from the Urgent Care and sent to the Emergency Department via POV . Per WC, patient is in need of higher level of care due to palpitations. Patient is aware and verbalizes understanding of plan of care.  ?Vitals:  ? 09/27/21 1142  ?BP: 135/90  ?Pulse: 94  ?Resp: 18  ?Temp: 97.9 ?F (36.6 ?C)  ?SpO2: 96%  ?  ?

## 2021-09-27 NOTE — Discharge Instructions (Signed)
I am concerned that your symptoms today may be related to hypokalemia, which is low potassium. ?I would recommend you be seen in the ER to get this lab evaluated, and if indicated, replacement. ?Please head to [facility] ED for further workup ?

## 2021-09-27 NOTE — ED Triage Notes (Signed)
Pt arrives pov, steady gait c/o palpitations x 2 weeks, worsening today with mild dizziness. Referred by UC for concern of hypokalemia. Denies CP, endorses feeling shob. Speaking in complete sentences.  ?

## 2021-09-27 NOTE — ED Provider Notes (Signed)
?[facility] ? ? ? ?CSN: [id number] ?Arrival date & time: 09/27/21  1122 ? ? ?  ? ?History   ?Chief Complaint ?Chief Complaint  ?Patient presents with  ? Palpitations  ? Dizziness  ? ? ?HPI ?Carmen Perkins is a 56 y.o. female.  ? ?Pleasant 56yo female presents with a 2-3 week hx of palpitations, however states this morning the palpitations were more frequent and also associated with dizziness which is new. Her BP this morning on her home cuff was 113/78. She is on diuretics. She has a script for potassium but admits she has not been taking them. She also was recently seen for a URI and has been taking 2 puffs of albuterol every 4 hours for the past several days. She denies CP or weakness. She denies slurred speech, headache, or facial abnormalities. No blurred vision. ? ? ?Palpitations ?Associated symptoms: dizziness   ?Dizziness ?Associated symptoms: palpitations   ? ?Past Medical History:  ?Diagnosis Date  ? Anxiety   ? Bronchitis   ? Depression   ? Diabetes ([place])   ? Environmental allergies   ? Environmental allergies   ? GERD (gastroesophageal reflux disease)   ? Glaucoma   ? Hypertension   ? PTSD (post-traumatic stress disorder)   ? ? ?There are no problems to display for this patient. ? ? ?Past Surgical History:  ?Procedure Laterality Date  ? ABDOMINAL HYSTERECTOMY    ? ? ?OB History   ?No obstetric history on file. ?  ? ? ? ?Home Medications   ? ?Prior to Admission medications   ?Medication Sig Start Date End Date Taking? Authorizing Provider  ?albuterol (PROVENTIL HFA;VENTOLIN HFA) 108 (90 BASE) MCG/ACT inhaler Inhale 2 puffs into the lungs every 4 (four) hours as needed. For wheezing or shortness of breath.    [provider]  ?amLODipine (NORVASC) 10 MG tablet Take 10 mg by mouth daily.    [provider]  ?bisacodyl (BISACODYL) 5 MG EC tablet Take 1 tablet (5 mg total) by mouth once. ?Patient not taking: Reported on 07/03/2020 09/12/15   Milus Banister, MD  ?Calcium  Carbonate-Vitamin D (CALCIUM 600 + D PO) Take 1 tablet by mouth 2 (two) times daily.    [provider]  ?carvedilol (COREG) 25 MG tablet Take 25 mg by mouth 2 (two) times daily. ?Patient not taking: Reported on 07/03/2020 12/06/15   [provider]  ?cetirizine (ZYRTEC) 10 MG tablet Take 10 mg by mouth daily.    [provider]  ?diclofenac (VOLTAREN) 75 MG EC tablet Take 75 mg by mouth 2 (two) times daily as needed.    [provider]  ?hydrochlorothiazide (HYDRODIURIL) 25 MG tablet Take 25 mg by mouth daily.    [provider]  ?hydrOXYzine (ATARAX/VISTARIL) 10 MG tablet Take 50 mg by mouth 2 (two) times daily as needed.    [provider]  ?ibuprofen (ADVIL,MOTRIN) 800 MG tablet Take 800 mg by mouth every 8 (eight) hours as needed. For back pain.    [provider]  ?losartan (COZAAR) 100 MG tablet Take 100 mg by mouth daily. 02/28/15   [provider]  ?metFORMIN (GLUCOPHAGE) 500 MG tablet Take 250 mg by mouth daily with breakfast. ?Patient not taking: Reported on 07/03/2020    [provider]  ?metoprolol tartrate (LOPRESSOR) 25 MG tablet Take 25 mg by mouth 2 (two) times daily. ?Patient not taking: Reported on 07/03/2020    [provider]  ?Multiple Vitamin (MULTIVITAMIN WITH MINERALS)  TABS tablet Take 1 tablet by mouth daily.    [provider]  ?PARoxetine (PAXIL) 40 MG tablet Take 60 mg by mouth daily. ?Patient not taking: Reported on 07/03/2020    [provider]  ?polyvinyl alcohol (LIQUIFILM TEARS) 1.4 % ophthalmic solution Place 1 drop into both eyes as needed. For dry eyes.    [provider]  ?potassium chloride SA (K-DUR,KLOR-CON) 20 MEQ tablet Take 20 mEq by mouth 2 (two) times daily.    [provider]  ?sennosides-docusate sodium (SENOKOT-S) 8.6-50 MG tablet Take 2 tablets by mouth 2 (two) times daily.    [provider]  ?vitamin C (ASCORBIC ACID) 250 MG tablet Take  500 mg by mouth daily.    [provider]  ? ? ?Family History ?Family History  ?Problem Relation Age of Onset  ? Colon cancer Neg Hx   ? ? ?Social History ?Social History  ? ?Tobacco Use  ? Smoking status: Never  ? Smokeless tobacco: Never  ?Substance Use Topics  ? Alcohol use: Yes  ?  Alcohol/week: 0.0 standard drinks  ?  Comment: occasional  ? Drug use: No  ? ? ? ?Allergies   ?Patient has no known allergies. ? ? ?Review of Systems ?Review of Systems  ?Cardiovascular:  Positive for palpitations.  ?Neurological:  Positive for dizziness.  ? ? ?Physical Exam ?Triage Vital Signs ?ED Triage Vitals [09/27/21 1142]  ?Enc Vitals Group  ?   BP 135/90  ?   Pulse Rate 94  ?   Resp 18  ?   Temp 97.9 ?F (36.6 ?C)  ?   Temp Source Oral  ?   SpO2 96 %  ?   Weight   ?   Height   ?   Head Circumference   ?   Peak Flow   ?   Pain Score 0  ?   Pain Loc   ?   Pain Edu?   ?   Excl. in [place]?   ? ?No data found. ? ?Updated Vital Signs ?BP 135/90 (BP Location: Left Arm)   Pulse 94   Temp 97.9 ?F (36.6 ?C) (Oral)   Resp 18   SpO2 96%  ? ?Visual Acuity ?Right Eye Distance:   ?Left Eye Distance:   ?Bilateral Distance:   ? ?Right Eye Near:   ?Left Eye Near:    ?Bilateral Near:    ? ?Physical Exam ?Vitals and nursing note reviewed.  ?Constitutional:   ?   General: She is not in acute distress. ?   Appearance: Normal appearance. She is not ill-appearing, toxic-appearing or diaphoretic.  ?HENT:  ?   Head: Normocephalic and atraumatic.  ?   Right Ear: Tympanic membrane, ear canal and external ear normal. There is no impacted cerumen.  ?   Left Ear: Tympanic membrane, ear canal and external ear normal. There is no impacted cerumen.  ?   Nose: Nose normal. No rhinorrhea.  ?Eyes:  ?   Pupils: Pupils are equal, round, and reactive to light.  ?   Comments: Mild conjunctival pallor  ?Cardiovascular:  ?   Rate and Rhythm: Normal rate and regular rhythm.  ?   Pulses: Normal pulses.  ?   Heart sounds: Normal heart sounds. No murmur heard. ?   No friction rub. No gallop.  ?Pulmonary:  ?   Effort: Pulmonary effort is normal. No respiratory distress.  ?   Breath sounds: Normal breath sounds. No wheezing.  ?Musculoskeletal:  ?  Cervical back: Normal range of motion.  ?Lymphadenopathy:  ?   Cervical: No cervical adenopathy.  ?Skin: ?   General: Skin is warm.  ?   Findings: No erythema or rash.  ?Neurological:  ?   General: No focal deficit present.  ?   Mental Status: She is alert and oriented to person, place, and time. Mental status is at baseline.  ?   Cranial Nerves: No cranial nerve deficit.  ?   Sensory: No sensory deficit.  ?   Motor: No weakness.  ?   Coordination: Coordination normal.  ?   Gait: Gait normal.  ? ? ? ?UC Treatments / Results  ?Labs ?(all labs ordered are listed, but only abnormal results are displayed) ?Labs Reviewed - No data to display ? ?EKG ? ? ?Radiology ?No results found. ? ?Procedures ?ED EKG ? ?Date/Time: 09/27/2021 12:00 PM ?Performed by: Chaney Malling, PA ?Authorized by: Chaney Malling, PA  ? ?Interpretation:  ?  Interpretation: abnormal   ?Rate:  ?  ECG rate assessment: normal   ?Rhythm:  ?  Rhythm: sinus rhythm   ?Ectopy:  ?  Ectopy: PVCs   ?ST segments:  ?  ST segments:  Non-specific ?T waves:  ?  T waves: non-specific   (including critical care time) ? ?Medications Ordered in UC ?Medications - No data to display ? ?Initial Impression / Assessment and Plan / UC Course  ?I have reviewed the triage vital signs and the nursing notes. ? ?Pertinent labs & imaging results that were available during my care of the patient were reviewed by me and considered in my medical decision making (see chart for details). ? ?  ? ?Palpitations - pt with 2-3 week hx of palpitations, recently worsened. In light of abnormal EKG, diuretic use, failure to take potassium supplement, and significant use of albuterol recently, I have significant concern for hypokalemia. Pt has not had routine labs checked in "a while." Would recommend ER workup,  primarily for labs to assess for possible causes. VSS in office, pt is non-toxic ?Dizziness - neuro exam normal. Suspect possibly metabolic in nature, will defer workup and tx plan to ER.  ? ?Final Clinical Impress

## 2021-09-27 NOTE — Discharge Instructions (Addendum)
Follow-up with your primary care doctor to further discuss diabetes medications. ?

## 2021-09-27 NOTE — ED Triage Notes (Signed)
Pt here for dizziness and palpitations x 2 weeks worse this morning  ?

## 2021-09-27 NOTE — ED Notes (Signed)
Pt discharged to home. Discharge instructions have been discussed with patient and/or family members. Pt verbally acknowledges understanding d/c instructions, and endorses comprehension to checkout at registration before leaving.  °

## 2021-10-09 ENCOUNTER — Ambulatory Visit
Admission: RE | Admit: 2021-10-09 | Discharge: 2021-10-09 | Disposition: A | Discharge status: Home / Self Care | Payer: Medicare Other | Source: Ambulatory Visit | Attending: Internal Medicine | Admitting: Internal Medicine

## 2021-10-09 ENCOUNTER — Other Ambulatory Visit: Payer: Self-pay | Admitting: Internal Medicine

## 2021-10-09 DIAGNOSIS — J209 Acute bronchitis, unspecified: Secondary | ICD-10-CM

## 2021-11-13 ENCOUNTER — Encounter: Payer: Self-pay | Admitting: Pulmonary Disease

## 2021-11-13 ENCOUNTER — Ambulatory Visit (INDEPENDENT_AMBULATORY_CARE_PROVIDER_SITE_OTHER): Payer: No Typology Code available for payment source | Admitting: Pulmonary Disease

## 2021-11-13 VITALS — BP 122/74 | HR 100 | Ht 64.0 in | Wt 174.2 lb

## 2021-11-13 DIAGNOSIS — R052 Subacute cough: Secondary | ICD-10-CM

## 2021-11-13 MED ORDER — SODIUM CHLORIDE 3 % IN NEBU: INHALATION_SOLUTION | RESPIRATORY_TRACT | 12 refills | Status: AC | PRN

## 2021-11-13 MED ORDER — CLARITHROMYCIN 500 MG PO TABS: 500.0000 mg | ORAL_TABLET | Freq: Two times a day (BID) | ORAL | 0 refills | Status: DC

## 2021-11-13 NOTE — Progress Notes (Addendum)
Carmen Perkins    [id number]    1965/12/19  Primary Care Physician:[facility]  Referring Physician: [place] [address] [place],  [place] [address]  Chief complaint:   Patient being seen for chronic cough, has had a cough for almost 3 months now  HPI:  She was treated with a course of antibiotics and steroids  She was exposed to some mold at her mom's house They will try to clean the mold up Exposure to black mold She does have a brother that had similar symptoms  Does have a background history of asthma for which she uses nebulization treatments Asthma was stable prior to recent exposures  Never smoked 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   albuterol (PROVENTIL HFA;VENTOLIN HFA) 108 (90 BASE) MCG/ACT inhaler Inhale 2 puffs into the lungs every 4 (four) hours as needed. For wheezing or shortness of breath.   amLODipine (NORVASC) 10 MG tablet Take 10 mg by mouth daily.   benzonatate (TESSALON) 100 MG capsule Take 100 mg by mouth 3 (three) times daily.   bisacodyl (BISACODYL) 5 MG EC tablet Take 1 tablet (5 mg total) by mouth once.   Calcium Carbonate-Vitamin D (CALCIUM 600 + D PO) Take 1 tablet by mouth 2 (two) times daily.   cetirizine (ZYRTEC) 10 MG tablet Take 10 mg by mouth daily.   clarithromycin (BIAXIN) 500 MG tablet Take 1 tablet (500 mg  total) by mouth 2 (two) times daily.   hydrochlorothiazide (HYDRODIURIL) 25 MG tablet Take 25 mg by mouth daily.   ibuprofen (ADVIL,MOTRIN) 800 MG tablet Take 800 mg by mouth every 8 (eight) hours as needed. For back pain.   losartan (COZAAR) 100 MG tablet Take 100 mg by mouth daily.   metFORMIN (GLUCOPHAGE-XR) 500 MG 24 hr tablet Take 500 mg by mouth daily with breakfast.   mometasone (ASMANEX) 220 MCG/ACT inhaler Inhale 2 puffs into the lungs daily.   Multiple Vitamin (MULTIVITAMIN WITH MINERALS) TABS tablet Take 1 tablet by mouth daily.   polyvinyl alcohol (LIQUIFILM TEARS) 1.4 % ophthalmic solution Place 1 drop into both eyes as needed. For dry eyes.   potassium chloride SA (K-DUR,KLOR-CON) 20 MEQ tablet Take 20 mEq by mouth 2 (two) times daily.   sennosides-docusate sodium (SENOKOT-S) 8.6-50 MG tablet Take 2 tablets by mouth 2 (two) times daily.   sodium chloride HYPERTONIC 3 % nebulizer solution Take by nebulization as needed for other.   vitamin C (ASCORBIC ACID) 250 MG tablet Take 500 mg by mouth daily.   carvedilol (COREG) 25 MG tablet Take 25 mg by mouth 2 (two) times daily. (Patient not taking: Reported on 07/03/2020)   diclofenac (VOLTAREN) 75 MG EC tablet Take 75 mg by mouth 2 (two) times daily as needed. (Patient not taking: Reported on 11/13/2021)   hydrOXYzine (ATARAX/VISTARIL) 10 MG  tablet Take 50 mg by mouth 2 (two) times daily as needed. (Patient not taking: Reported on 11/13/2021)   metFORMIN (GLUCOPHAGE) 500 MG tablet Take 1 tablet (500 mg total) by mouth 2 (two) times daily with a meal.   metoprolol tartrate (LOPRESSOR) 25 MG tablet Take 25 mg by mouth 2 (two) times daily. (Patient not taking: Reported on 07/03/2020)   PARoxetine (PAXIL) 40 MG tablet Take 60 mg by mouth daily. (Patient not taking: Reported on 07/03/2020)   Facility-Administered Encounter Medications as of 11/13/2021  Medication   0.9 %  sodium chloride infusion    Allergies as of 11/13/2021 - Review Complete  11/13/2021  Allergen Reaction Noted   Atenolol  03/22/2017   Lisinopril  03/22/2017    Past Medical History:  Diagnosis Date   Anxiety    Bronchitis    Depression    Diabetes ([place])    Environmental allergies    Environmental allergies    GERD (gastroesophageal reflux disease)    Glaucoma    Hypertension    PTSD (post-traumatic stress disorder)     Past Surgical History:  Procedure Laterality Date   ABDOMINAL HYSTERECTOMY      Family History  Problem Relation Age of Onset   Colon cancer Neg Hx     Social History   Socioeconomic History   Marital status: Divorced    Spouse name: Not on file   Number of children: Not on file   Years of education: Not on file   Highest education level: Not on file  Occupational History   Not on file  Tobacco Use   Smoking status: Never   Smokeless tobacco: Never  Substance and Sexual Activity   Alcohol use: Not Currently    Comment: occasional   Drug use: No   Sexual activity: Not on file  Other Topics Concern   Not on file  Social History Narrative   Not on file   Social Determinants of Health   Financial Resource Strain: Not on file  Food Insecurity: Not on file  Transportation Needs: Not on file  Physical Activity: Not on file  Stress: Not on file  Social Connections: Not on file  Intimate Partner Violence: Not on file    Review of Systems  Respiratory:  Positive for cough and shortness of breath.     Vitals:   11/13/21 1046  BP: 122/74  Pulse: 100  SpO2: 99%     Physical Exam Constitutional:      Appearance: Normal appearance.  HENT:     Head: Normocephalic.     Mouth/Throat:     Mouth: Mucous membranes are moist.  Cardiovascular:     Rate and Rhythm: Normal rate and regular rhythm.     Heart sounds: No murmur heard.    No friction rub.  Pulmonary:     Effort: No respiratory distress.     Breath sounds: No stridor. Rhonchi present. No wheezing.  Musculoskeletal:     Cervical back: No rigidity  or tenderness.  Neurological:     Mental Status: She is alert.  Psychiatric:        Mood and Affect: Mood normal.      Data Reviewed: No significant records on care everywhere regarding recent exacerbation  Records from the [place] did indicate that she was evaluated for the bronchitis She is on nebulization treatments-records reviewed  CT scan report noted for evidence of bronchitis, mucous plugging-October 29, 2021  Recent pulmonary function tests -Reveals moderate obstructive disease, mild  restriction with mild reduction in diffusing capacity  Assessment:  Chronic bronchitis  Mold exposure  Past history of asthma  Persistent bronchitis  Abnormal CT scan of the chest  Abnormal PFT    Plan/Recommendations: Obtain sputum for Gram stain and cultures and also for fungal cultures  Prescription for Biaxin 500 p.o. twice daily for 10 days  Continue using inhalers and nebulization treatments  Hypertonic saline was sent to pharmacy to be used with nebulizer, this may help clear secretions as well  Will request for the actual CT films to be reviewed by myself  She may require bronchoscopy if sputum culture is unyielding and does not improve with antibiotic therapy  Encouraged to call with significant concerns  Will be scheduled for follow-up in 2 weeks   Sherrilyn Rist MD [facility] 11/13/2021, 11:10 AM  CC: Carmen [place], MD  CT films were reviewed by myself showing evidence of mucous plugging worse at the left base, no associated atelectasis No lung nodularity.

## 2021-11-13 NOTE — Patient Instructions (Signed)
I will see you back in about 2 weeks  Sputum for Gram stain and cultures Sputum for fungus cultures -Once you get a sputum sample do not let it sit around on the table for too long before you get it to the lab to be tested  Course of antibiotics called to pharmacy  Continue using your nebulization treatments  Prescription for hypertonic saline to be used through your nebulizer also called in, this can be done about twice a day as needed -It helps liquefy secretions to help clear it better  We will request for copy of your CT-to review the actual films  You may require a bronchoscopy if the antibiotics and the sputum do not give [place] good guidance with respect to you feeling better generally  Call with significant concerns

## 2021-11-16 ENCOUNTER — Other Ambulatory Visit: Payer: Medicare Other

## 2021-11-16 DIAGNOSIS — R052 Subacute cough: Secondary | ICD-10-CM

## 2021-11-27 ENCOUNTER — Encounter: Payer: Self-pay | Admitting: Adult Health

## 2021-11-27 ENCOUNTER — Ambulatory Visit (INDEPENDENT_AMBULATORY_CARE_PROVIDER_SITE_OTHER): Payer: No Typology Code available for payment source | Admitting: Adult Health

## 2021-11-27 VITALS — BP 110/80 | HR 94 | Temp 97.7°F | Ht 64.0 in | Wt 175.0 lb

## 2021-11-27 DIAGNOSIS — R052 Subacute cough: Secondary | ICD-10-CM | POA: Diagnosis not present

## 2021-11-27 DIAGNOSIS — R053 Chronic cough: Secondary | ICD-10-CM | POA: Diagnosis not present

## 2021-11-27 DIAGNOSIS — B3731 Acute candidiasis of vulva and vagina: Secondary | ICD-10-CM | POA: Diagnosis not present

## 2021-11-27 MED ORDER — FLUCONAZOLE 150 MG PO TABS: 150.0000 mg | ORAL_TABLET | Freq: Once | ORAL | 0 refills | Status: AC

## 2021-11-27 NOTE — Assessment & Plan Note (Signed)
Most likely secondary to recent antibiotic therapy.  Diflucan 150 mg x 1 given today in office.  If symptoms are improving will need follow-up with GYN

## 2021-11-27 NOTE — Assessment & Plan Note (Signed)
Chronic cough x3 months.  CT chest with severe bronchial wall thickening and multiple focal areas of bronchial plugging-sputum fungal cultures were positive for mold.  Identification is pending.  Patient has had significant clinical benefit with clarithromycin x10 days.  Patient has minimum symptoms currently.  We will await mold culture final report.  Have patient have close follow-up in 3 to 4 weeks.  If still remains symptomatic consider repeat cultures and treatment if indicated.  She is not immunocompromise and has clinical improvement.  For now hold off on any additional treatment with antifungals until we have final cultures and follow-up visit. We will continue on aggressive mucociliary clearance.  Consider repeat CT chest in 3 months. We will check IgE level today.  Patient does have a history of asthma.   Plan  Patient Instructions  Labs today  Continue on Hypertonic nebs 1- 2 times daily as needed  Continue on Duoneb twice daily as needed  Diflucan '150mg'$  daily x 1 dose .  Continue on Asmanex 2 puffs daily, rinse after use.  Albuterol inhaler As needed   Follow up in 3 weeks with Dr. Ander Slade or Arlo Butt NP and As needed   Please contact office for sooner follow up if symptoms do not improve or worsen or seek emergency care

## 2021-11-27 NOTE — Progress Notes (Signed)
$'@Patient'w$  ID: Carmen Perkins, female    DOB: 01-14-1966, 56 y.o.   MRN: [id number]  Chief Complaint  Patient presents with   Follow-up    Referring provider: [facility]  HPI: 56 year old female seen for pulmonary consult November 13, 2021 for chronic cough x3 months Medical history significant for asthma Retired from [employer] , gets care through the [employer].   TEST/EVENTS :   11/27/2021 Follow up ; Chronic cough  Patient returns for 2-week follow-up.  Patient was seen last visit for pulmonary consult for chronic cough x3 months.  Patient had been exposed to mold while cleaning her mother's house.  She has underlying asthma is on Asmanex twice daily.  Chest x-ray April and May were both clear. she was treated with 10-day course of clarithromycin.  Started on hypertonic nebs twice daily as needed. Duoneb As needed   Sputum culture showed normal oropharyngeal flora.  Fungal culture was positive for mold, identification is pending .  Since last visit patient is feeling so much better, dramatic improvement. Minimal cough now. Feels good.  Had CT chest at [facility]. Records requested.  Labs shows absolute eosinophils were 200.  CT chest 10/29/21 showed severe bronchial wall thickening with miltifocal area of plugging .  Brother has been hospitalized with similar symptoms . This all happened cleaning the house with mold.   Patient does request a prescription for Diflucan as she has a vaginal yeast infection after taking antibiotics.   Allergies  Allergen Reactions   Atenolol    Lisinopril     Immunization History  Administered Date(s) Administered   Influenza Inj Mdck Quad With Preservative 03/21/2019   Influenza,inj,Quad PF,6+ Mos 03/15/2017, 02/13/2021   Moderna Sars-Covid-2 Vaccination 07/12/2019, 08/09/2019   Pneumococcal Conjugate-13 03/21/2019   Pneumococcal Polysaccharide-23 04/29/2010   Tdap 04/29/2010, 03/15/2017    Past Medical History:  Diagnosis Date    Anxiety    Bronchitis    Depression    Diabetes ([place])    Environmental allergies    Environmental allergies    GERD (gastroesophageal reflux disease)    Glaucoma    Hypertension    PTSD (post-traumatic stress disorder)     Tobacco History: Social History   Tobacco Use  Smoking Status Never  Smokeless Tobacco Never   Counseling given: Not Answered   Outpatient Medications Prior to Visit  Medication Sig Dispense Refill   albuterol (2.5 MG/3ML) 0.083% NEBU 3 mL, albuterol (5 MG/ML) 0.5% NEBU 0.5 mL Inhale into the lungs.     albuterol (PROVENTIL HFA;VENTOLIN HFA) 108 (90 BASE) MCG/ACT inhaler Inhale 2 puffs into the lungs every 4 (four) hours as needed. For wheezing or shortness of breath.     amLODipine (NORVASC) 10 MG tablet Take 10 mg by mouth daily.     benzonatate (TESSALON) 100 MG capsule Take 100 mg by mouth 3 (three) times daily.     bisacodyl (BISACODYL) 5 MG EC tablet Take 1 tablet (5 mg total) by mouth once. 30 tablet 0   Calcium Carbonate-Vitamin D (CALCIUM 600 + D PO) Take 1 tablet by mouth 2 (two) times daily.     cetirizine (ZYRTEC) 10 MG tablet Take 10 mg by mouth daily.     clarithromycin (BIAXIN) 500 MG tablet Take 1 tablet (500 mg total) by mouth 2 (two) times daily. 20 tablet 0   diclofenac (VOLTAREN) 75 MG EC tablet Take 75 mg by mouth 2 (two) times daily as needed.     hydrochlorothiazide (HYDRODIURIL) 25  MG tablet Take 25 mg by mouth daily.     hydrOXYzine (ATARAX/VISTARIL) 10 MG tablet Take 50 mg by mouth 2 (two) times daily as needed.     ibuprofen (ADVIL,MOTRIN) 800 MG tablet Take 800 mg by mouth every 8 (eight) hours as needed. For back pain.     losartan (COZAAR) 100 MG tablet Take 100 mg by mouth daily.  5   metFORMIN (GLUCOPHAGE-XR) 500 MG 24 hr tablet Take 500 mg by mouth daily with breakfast.     mometasone (ASMANEX) 220 MCG/ACT inhaler Inhale 2 puffs into the lungs daily.     Multiple Vitamin (MULTIVITAMIN WITH MINERALS) TABS tablet Take 1  tablet by mouth daily.     polyvinyl alcohol (LIQUIFILM TEARS) 1.4 % ophthalmic solution Place 1 drop into both eyes as needed. For dry eyes.     potassium chloride SA (K-DUR,KLOR-CON) 20 MEQ tablet Take 20 mEq by mouth 2 (two) times daily.     sennosides-docusate sodium (SENOKOT-S) 8.6-50 MG tablet Take 2 tablets by mouth 2 (two) times daily.     sodium chloride HYPERTONIC 3 % nebulizer solution Take by nebulization as needed for other. 750 mL 12   vitamin C (ASCORBIC ACID) 250 MG tablet Take 500 mg by mouth daily.     carvedilol (COREG) 25 MG tablet Take 25 mg by mouth 2 (two) times daily. (Patient not taking: Reported on 07/03/2020)  5   metFORMIN (GLUCOPHAGE) 500 MG tablet Take 1 tablet (500 mg total) by mouth 2 (two) times daily with a meal. 60 tablet 0   metoprolol tartrate (LOPRESSOR) 25 MG tablet Take 25 mg by mouth 2 (two) times daily. (Patient not taking: Reported on 07/03/2020)     PARoxetine (PAXIL) 40 MG tablet Take 60 mg by mouth daily. (Patient not taking: Reported on 07/03/2020)     Facility-Administered Medications Prior to Visit  Medication Dose Route Frequency Provider Last Rate Last Admin   0.9 %  sodium chloride infusion  500 mL Intravenous Continuous Milus Banister, MD         Review of Systems:   Constitutional:   No  weight loss, night sweats,  Fevers, chills, fatigue, or  lassitude.  HEENT:   No headaches,  Difficulty swallowing,  Tooth/dental problems, or  Sore throat,                No sneezing, itching, ear ache, nasal congestion, post nasal drip,   CV:  No chest pain,  Orthopnea, PND, swelling in lower extremities, anasarca, dizziness, palpitations, syncope.   GI  No heartburn, indigestion, abdominal pain, nausea, vomiting, diarrhea, change in bowel habits, loss of appetite, bloody stools.   Resp: .  No chest wall deformity  Skin: no rash or lesions.  GU: no dysuria, change in color of urine, no urgency or frequency.  No flank pain, no hematuria   MS:  No  joint pain or swelling.  No decreased range of motion.  No back pain.    Physical Exam  BP 110/80 (BP Location: Left Arm, Patient Position: Sitting, Cuff Size: Large)   Pulse 94   Temp 97.7 F (36.5 C) (Oral)   Ht '5\' 4"'$  (1.626 m)   Wt 175 lb (79.4 kg)   SpO2 98%   BMI 30.04 kg/m   GEN: A/Ox3; pleasant , NAD, well nourished    HEENT:  [place]/AT,  EACs-clear, TMs-wnl, NOSE-clear, THROAT-clear, no lesions, no postnasal drip or exudate noted.   NECK:  Supple w/ fair ROM;  no JVD; normal carotid impulses w/o bruits; no thyromegaly or nodules palpated; no lymphadenopathy.    RESP  Clear  P & A; w/o, wheezes/ rales/ or rhonchi. no accessory muscle use, no dullness to percussion  CARD:  RRR, no m/r/g, no peripheral edema, pulses intact, no cyanosis or clubbing.  GI:   Soft & nt; nml bowel sounds; no organomegaly or masses detected.   Musco: Warm bil, no deformities or joint swelling noted.   Neuro: alert, no focal deficits noted.    Skin: Warm, no lesions or rashes    Lab Results:  CBC    Component Value Date/Time   WBC 6.1 09/27/2021 1242   RBC 4.51 09/27/2021 1242   HGB 13.0 09/27/2021 1242   HCT 39.5 09/27/2021 1242   PLT 270 09/27/2021 1242   MCV 87.6 09/27/2021 1242   MCH 28.8 09/27/2021 1242   MCHC 32.9 09/27/2021 1242   RDW 13.7 09/27/2021 1242   LYMPHSABS 1.9 09/27/2021 1242   MONOABS 0.3 09/27/2021 1242   EOSABS 0.2 09/27/2021 1242   BASOSABS 0.0 09/27/2021 1242    BMET    Component Value Date/Time   NA 138 09/27/2021 1242   K 3.5 09/27/2021 1242   CL 103 09/27/2021 1242   CO2 29 09/27/2021 1242   GLUCOSE 239 (H) 09/27/2021 1242   BUN 15 09/27/2021 1242   CREATININE 0.72 09/27/2021 1242   CALCIUM 9.6 09/27/2021 1242   GFRNONAA >60 09/27/2021 1242   GFRAA >60 05/10/2015 1217    BNP No results found for: "BNP"  ProBNP No results found for: "PROBNP"  Imaging: No results found.        No data to display          No results found for:  "NITRICOXIDE"      Assessment & Plan:   Chronic cough Chronic cough x3 months.  CT chest with severe bronchial wall thickening and multiple focal areas of bronchial plugging-sputum fungal cultures were positive for mold.  Identification is pending.  Patient has had significant clinical benefit with clarithromycin x10 days.  Patient has minimum symptoms currently.  We will await mold culture final report.  Have patient have close follow-up in 3 to 4 weeks.  If still remains symptomatic consider repeat cultures and treatment if indicated.  She is not immunocompromise and has clinical improvement.  For now hold off on any additional treatment with antifungals until we have final cultures and follow-up visit. We will continue on aggressive mucociliary clearance.  Consider repeat CT chest in 3 months. We will check IgE level today.  Patient does have a history of asthma.   Plan  Patient Instructions  Labs today  Continue on Hypertonic nebs 1- 2 times daily as needed  Continue on Duoneb twice daily as needed  Diflucan '150mg'$  daily x 1 dose .  Continue on Asmanex 2 puffs daily, rinse after use.  Albuterol inhaler As needed   Follow up in 3 weeks with Dr. Ander Slade or Kohl Polinsky NP and As needed   Please contact office for sooner follow up if symptoms do not improve or worsen or seek emergency care       Vagina, candidiasis Most likely secondary to recent antibiotic therapy.  Diflucan 150 mg x 1 given today in office.  If symptoms are improving will need follow-up with GYN    Rexene Edison, NP 11/27/2021

## 2021-11-27 NOTE — Patient Instructions (Addendum)
Labs today  Continue on Hypertonic nebs 1- 2 times daily as needed  Continue on Duoneb twice daily as needed  Diflucan '150mg'$  daily x 1 dose .  Continue on Asmanex 2 puffs daily, rinse after use.  Albuterol inhaler As needed   Follow up in 3 weeks with Dr. Ander Slade or Braelynne Garinger NP and As needed   Please contact office for sooner follow up if symptoms do not improve or worsen or seek emergency care

## 2021-11-30 LAB — IGE: IgE (Immunoglobulin E), Serum: 16 kU/L (ref <=114)

## 2021-12-07 ENCOUNTER — Telehealth: Payer: Self-pay | Admitting: Pulmonary Disease

## 2021-12-07 MED ORDER — CEFDINIR 300 MG PO CAPS: 300.0000 mg | ORAL_CAPSULE | Freq: Two times a day (BID) | ORAL | 0 refills | Status: DC

## 2021-12-07 NOTE — Telephone Encounter (Signed)
Called and left voicemail for patient to call office back to sickie call

## 2021-12-07 NOTE — Telephone Encounter (Signed)
Can begin Omnicef '300mg'$  Twice daily  for 7 days, #14  Mucinex DM Twice daily  As needed  cough/congestion    Please call labs to see when the fungal culture identification information will be ready   Keep follow up in 2 weeks and As needed   Please contact office for sooner follow up if symptoms do not improve or worsen or seek emergency care

## 2021-12-07 NOTE — Telephone Encounter (Signed)
I spoke with the pt and notified of response per Tammy. She verbalized understanding. Rx sent to pharm. She is scheduled for f/u with Tammy on 12/18/21 and she will keep this appt. I called [facility] and checked on fungal cx. I was advised that it takes 6-8 wks for the final to come back. Date collected was 11/16/21.

## 2021-12-07 NOTE — Telephone Encounter (Signed)
Patient stated she is having right ear pain. Patient stated it started last week. Patient is coughing with yellow mucus production, no fever. Patient also stated she feels that her chest is full.   TP, please advise.

## 2021-12-09 ENCOUNTER — Other Ambulatory Visit: Payer: Self-pay | Admitting: Acute Care

## 2021-12-09 DIAGNOSIS — N761 Subacute and chronic vaginitis: Secondary | ICD-10-CM

## 2021-12-09 MED ORDER — FLUCONAZOLE 150 MG PO TABS: 150.0000 mg | ORAL_TABLET | Freq: Once | ORAL | 0 refills | Status: AC

## 2021-12-09 NOTE — Telephone Encounter (Signed)
Called and spoke with patient, she has a vaginal yeast infection.

## 2021-12-09 NOTE — Telephone Encounter (Signed)
Called and spoke with patient, advised of recommendations per Eric Form NP.  She states she has already been itching and will have to take it when she gets it and sometimes she has to take another dose if it does not clear up.  Advised to finish her antibiotic and to call us back if she is not better after completing the antibiotic.  Nothing further needed.

## 2021-12-09 NOTE — Telephone Encounter (Signed)
Patient states med causes yeast infections. Would like Diflucan called into pharmacy. Pharmacy is [facility] [address] [facility] . Patient phone number is [phone].

## 2021-12-09 NOTE — Telephone Encounter (Signed)
She reports that since she on the second round of ABT and she last seen Tammy and is requesting Diflucan to be sent in as this will give her a yeast infection and it had done so in the past. She will normally take OTC yeast cream and it does not touch the yeast infection in the past. She reports the pill works best.   Please advise if ok to send in [place] since [place] is out of the office?

## 2021-12-14 LAB — RESPIRATORY CULTURE OR RESPIRATORY AND SPUTUM CULTURE
MICRO NUMBER:: 13600985
RESULT:: NORMAL
SPECIMEN QUALITY:: ADEQUATE

## 2021-12-14 LAB — FUNGUS CULTURE W SMEAR
MICRO NUMBER:: 13600984
SMEAR:: NONE SEEN
SPECIMEN QUALITY:: ADEQUATE

## 2021-12-18 ENCOUNTER — Ambulatory Visit (INDEPENDENT_AMBULATORY_CARE_PROVIDER_SITE_OTHER): Payer: No Typology Code available for payment source | Admitting: Adult Health

## 2021-12-18 ENCOUNTER — Encounter: Payer: Self-pay | Admitting: Adult Health

## 2021-12-18 VITALS — BP 116/80 | HR 96 | Temp 98.3°F | Ht 64.0 in | Wt 176.0 lb

## 2021-12-18 DIAGNOSIS — R053 Chronic cough: Secondary | ICD-10-CM | POA: Diagnosis not present

## 2021-12-18 DIAGNOSIS — J4541 Moderate persistent asthma with (acute) exacerbation: Secondary | ICD-10-CM

## 2021-12-18 MED ORDER — MOMETASONE FURO-FORMOTEROL FUM 200-5 MCG/ACT IN AERO: 2.0000 | INHALATION_SPRAY | Freq: Two times a day (BID) | RESPIRATORY_TRACT | 5 refills | Status: DC

## 2021-12-18 MED ORDER — PREDNISONE 20 MG PO TABS: 20.0000 mg | ORAL_TABLET | Freq: Every day | ORAL | 0 refills | Status: DC

## 2021-12-18 NOTE — Assessment & Plan Note (Signed)
Chronic cough x3 to 4 months.  CT chest report shows severe bronchial wall thickening and multiple focal areas of bronchial plugging.  Final sputum fungal cultures were negative for fungus.  She has completed a 10-day course of clarithromycin.  Patient did have significant improvement in symptoms however over the last week cough seems to be coming back.  We will treat for upper airway cough syndrome with triggers such as chronic rhinitis and GERD.  We will add in Pepcid at bedtime and chlor tabs at bedtime.  Add in cough control regimen with Delsym and Tessalon.  And a short course of prednisone.  Check allergy panel.  Previous IgE was normal We increase Asmanex to ICS/LABA to control for more asthma symptoms  Plan  Patient Instructions  Labs today  Continue on Hypertonic nebs 1- 2 times daily as needed  Continue on Duoneb twice daily as needed  Begin Delsym 2 tsp Twice daily  for cough As needed   Tessalon Three times a day  for cough As needed   Prednisone '20mg'$  dailyf or 5 days.  Stop Asmanex , Begin Dulera 200 2 puffs Twice daily  , rinse after use.  Continue on Prilosec daily  Albuterol inhaler As needed   Continue on Zyrtec daily  Add Chlorpheniramine '4mg'$  At bedtime   Add Pepcid '20mg'$  At bedtime   Follow up in 4-6  weeks with Dr. Ander Slade or Simran Mannis NP with PFTs  and As needed   Please contact office for sooner follow up if symptoms do not improve or worsen or seek emergency care

## 2021-12-18 NOTE — Progress Notes (Signed)
$'@Patient'C$  ID: Peter Congo, female    DOB: [birth year], 56 y.o.   MRN: [id number]  No chief complaint on file.   Referring provider: [facility], [facility]*  HPI: 56 year old female seen for pulmonary consult November 13, 2021 for chronic cough x3 months Medical history significant for asthma Retired from [employer], gets care through the [place] in [place]  TEST/EVENTS :  Eosinophils 200 CT chest October 29, 2021 showed severe bronchial wall thickening with multi focal areas of mucous plugging.  Final fungal cultures November 16, 2021 showed no fungal elements seen, possible contaminant no additional fungi isolated,  unidentified mold not resembling any of the known human pathogens no further work-up   12/18/2021 Follow up : Chronic cough  Patient returns for a 2-week follow-up.  Patient was seen late June for pulmonary consult for cough over the last 3 months.  She had developed a cough after cleaning out her mother's house felt that she had been exposed to mold as they had seen areas on the wall.  Patient have asthma and is on Asmanex twice daily.  Chest x-ray April and May were both clear.  She was treated with a 10-day course of clarithromycin.  And started on hypertonic nebs twice daily as needed.  And DuoNeb as needed.  Initial sputum cultures were negative.  Initial fungal culture did show a positive mold with identification following.  Final fungal cultures as above were essentially negative with no fungal elements seen felt to be a possible contaminant.  No additional fungi was isolated. Patient says very well until 1 week ago when her cough started to come back.  The cough is not as bad as it was when it initially started however she is coughing quite frequently throughout the day.  She denies any fever, discolored mucus, hemoptysis, orthopnea or edema.    Allergies  Allergen Reactions   Atenolol    Lisinopril     Immunization History  Administered Date(s) Administered   H1N1 09/05/2008    Influenza Inj Mdck Quad With Preservative 03/21/2019   Influenza Split 02/15/2015, 05/18/2015, 03/30/2018   Influenza, Seasonal, Injecte, Preservative Fre 05/22/2009, 02/02/2010   Influenza,inj,Quad PF,6+ Mos 03/15/2017, 02/13/2021   Influenza-Unspecified 04/20/2007, 05/27/2008, 04/27/2012, 08/20/2013, 02/14/2014, 04/01/2015, 02/15/2019, 01/16/2020   Moderna Sars-Covid-2 Vaccination 07/12/2019, 08/09/2019   Pneumococcal Conjugate-13 03/21/2019   Pneumococcal Polysaccharide-23 04/29/2010   Tdap 04/29/2010, 03/15/2017    Past Medical History:  Diagnosis Date   Anxiety    Bronchitis    Depression    Diabetes ([place])    Environmental allergies    Environmental allergies    GERD (gastroesophageal reflux disease)    Glaucoma    Hypertension    PTSD (post-traumatic stress disorder)     Tobacco History: Social History   Tobacco Use  Smoking Status Never  Smokeless Tobacco Never   Counseling given: Not Answered   Outpatient Medications Prior to Visit  Medication Sig Dispense Refill   albuterol (2.5 MG/3ML) 0.083% NEBU 3 mL, albuterol (5 MG/ML) 0.5% NEBU 0.5 mL Inhale into the lungs.     albuterol (PROVENTIL HFA;VENTOLIN HFA) 108 (90 BASE) MCG/ACT inhaler Inhale 2 puffs into the lungs every 4 (four) hours as needed. For wheezing or shortness of breath.     cetirizine (ZYRTEC) 10 MG tablet Take 10 mg by mouth daily.     mometasone (ASMANEX) 220 MCG/ACT inhaler Inhale 2 puffs into the lungs daily.     amLODipine (NORVASC) 10 MG tablet Take 10 mg by mouth daily.  benzonatate (TESSALON) 100 MG capsule Take 100 mg by mouth 3 (three) times daily.     bisacodyl (BISACODYL) 5 MG EC tablet Take 1 tablet (5 mg total) by mouth once. 30 tablet 0   Calcium Carbonate-Vitamin D (CALCIUM 600 + D PO) Take 1 tablet by mouth 2 (two) times daily.     cefdinir (OMNICEF) 300 MG capsule Take 1 capsule (300 mg total) by mouth 2 (two) times daily. 14 capsule 0   clarithromycin (BIAXIN) 500 MG tablet  Take 1 tablet (500 mg total) by mouth 2 (two) times daily. 20 tablet 0   diclofenac (VOLTAREN) 75 MG EC tablet Take 75 mg by mouth 2 (two) times daily as needed.     hydrochlorothiazide (HYDRODIURIL) 25 MG tablet Take 25 mg by mouth daily.     hydrOXYzine (ATARAX/VISTARIL) 10 MG tablet Take 50 mg by mouth 2 (two) times daily as needed.     ibuprofen (ADVIL,MOTRIN) 800 MG tablet Take 800 mg by mouth every 8 (eight) hours as needed. For back pain.     losartan (COZAAR) 100 MG tablet Take 100 mg by mouth daily.  5   metFORMIN (GLUCOPHAGE-XR) 500 MG 24 hr tablet Take 500 mg by mouth daily with breakfast.     Multiple Vitamin (MULTIVITAMIN WITH MINERALS) TABS tablet Take 1 tablet by mouth daily.     polyvinyl alcohol (LIQUIFILM TEARS) 1.4 % ophthalmic solution Place 1 drop into both eyes as needed. For dry eyes.     potassium chloride SA (K-DUR,KLOR-CON) 20 MEQ tablet Take 20 mEq by mouth 2 (two) times daily.     sennosides-docusate sodium (SENOKOT-S) 8.6-50 MG tablet Take 2 tablets by mouth 2 (two) times daily.     sodium chloride HYPERTONIC 3 % nebulizer solution Take by nebulization as needed for other. 750 mL 12   vitamin C (ASCORBIC ACID) 250 MG tablet Take 500 mg by mouth daily.     Facility-Administered Medications Prior to Visit  Medication Dose Route Frequency Provider Last Rate Last Admin   0.9 %  sodium chloride infusion  500 mL Intravenous Continuous Milus Banister, MD         Review of Systems:   Constitutional:   No  weight loss, night sweats,  Fevers, chills,  +fatigue, or  lassitude.  HEENT:   No headaches,  Difficulty swallowing,  Tooth/dental problems, or  Sore throat,                No sneezing, itching, ear ache,  +nasal congestion, post nasal drip,   CV:  No chest pain,  Orthopnea, PND, swelling in lower extremities, anasarca, dizziness, palpitations, syncope.   GI  No heartburn, indigestion, abdominal pain, nausea, vomiting, diarrhea, change in bowel habits, loss of  appetite, bloody stools.   Resp: No shortness of breath with exertion or at rest.  No excess mucus, no productive cough,  No non-productive cough,  No coughing up of blood.  No change in color of mucus.  No wheezing.  No chest wall deformity  Skin: no rash or lesions.  GU: no dysuria, change in color of urine, no urgency or frequency.  No flank pain, no hematuria   MS:  No joint pain or swelling.  No decreased range of motion.  No back pain.    Physical Exam  BP 116/80 (BP Location: Left Arm)   Pulse 96   Temp 98.3 F (36.8 C) (Oral)   Ht '5\' 4"'$  (1.626 m)   Wt 176 lb (  79.8 kg)   SpO2 95%   BMI 30.21 kg/m   GEN: A/Ox3; pleasant , NAD, well nourished    HEENT:  [place]/AT,  NOSE-clear, THROAT-clear, no lesions, no postnasal drip or exudate noted.   NECK:  Supple w/ fair ROM; no JVD; normal carotid impulses w/o bruits; no thyromegaly or nodules palpated; no lymphadenopathy.    RESP  Clear  P & A; w/o, wheezes/ rales/ or rhonchi. no accessory muscle use, no dullness to percussion  CARD:  RRR, no m/r/g, no peripheral edema, pulses intact, no cyanosis or clubbing.  GI:   Soft & nt; nml bowel sounds; no organomegaly or masses detected.   Musco: Warm bil, no deformities or joint swelling noted.   Neuro: alert, no focal deficits noted.    Skin: Warm, no lesions or rashes    Lab Results:  CBC    Component Value Date/Time   WBC 6.1 09/27/2021 1242   RBC 4.51 09/27/2021 1242   HGB 13.0 09/27/2021 1242   HCT 39.5 09/27/2021 1242   PLT 270 09/27/2021 1242   MCV 87.6 09/27/2021 1242   MCH 28.8 09/27/2021 1242   MCHC 32.9 09/27/2021 1242   RDW 13.7 09/27/2021 1242   LYMPHSABS 1.9 09/27/2021 1242   MONOABS 0.3 09/27/2021 1242   EOSABS 0.2 09/27/2021 1242   BASOSABS 0.0 09/27/2021 1242    BMET    Component Value Date/Time   NA 138 09/27/2021 1242   K 3.5 09/27/2021 1242   CL 103 09/27/2021 1242   CO2 29 09/27/2021 1242   GLUCOSE 239 (H) 09/27/2021 1242   BUN 15  09/27/2021 1242   CREATININE 0.72 09/27/2021 1242   CALCIUM 9.6 09/27/2021 1242   GFRNONAA >60 09/27/2021 1242   GFRAA >60 05/10/2015 1217    BNP No results found for: "BNP"  ProBNP No results found for: "PROBNP"  Imaging: No results found.        No data to display          No results found for: "NITRICOXIDE"      Assessment & Plan:   Chronic cough Chronic cough x3 to 4 months.  CT chest report shows severe bronchial wall thickening and multiple focal areas of bronchial plugging.  Final sputum fungal cultures were negative for fungus.  She has completed a 10-day course of clarithromycin.  Patient did have significant improvement in symptoms however over the last week cough seems to be coming back.  We will treat for upper airway cough syndrome with triggers such as chronic rhinitis and GERD.  We will add in Pepcid at bedtime and chlor tabs at bedtime.  Add in cough control regimen with Delsym and Tessalon.  And a short course of prednisone.  Check allergy panel.  Previous IgE was normal We increase Asmanex to ICS/LABA to control for more asthma symptoms  Plan  Patient Instructions  Labs today  Continue on Hypertonic nebs 1- 2 times daily as needed  Continue on Duoneb twice daily as needed  Begin Delsym 2 tsp Twice daily  for cough As needed   Tessalon Three times a day  for cough As needed   Prednisone '20mg'$  dailyf or 5 days.  Stop Asmanex , Begin Dulera 200 2 puffs Twice daily  , rinse after use.  Continue on Prilosec daily  Albuterol inhaler As needed   Continue on Zyrtec daily  Add Chlorpheniramine '4mg'$  At bedtime   Add Pepcid '20mg'$  At bedtime   Follow up in 4-6  weeks with  Dr. Ander Slade or Odeth Bry NP with PFTs  and As needed   Please contact office for sooner follow up if symptoms do not improve or worsen or seek emergency care        Rexene Edison, NP 12/18/2021

## 2021-12-18 NOTE — Patient Instructions (Addendum)
Labs today  Continue on Hypertonic nebs 1- 2 times daily as needed  Continue on Duoneb twice daily as needed  Begin Delsym 2 tsp Twice daily  for cough As needed   Tessalon Three times a day  for cough As needed   Prednisone '20mg'$  dailyf or 5 days.  Stop Asmanex , Begin Dulera 200 2 puffs Twice daily  , rinse after use.  Continue on Prilosec daily  Albuterol inhaler As needed   Continue on Zyrtec daily  Add Chlorpheniramine '4mg'$  At bedtime   Add Pepcid '20mg'$  At bedtime   Follow up in 4-6  weeks with Dr. Ander Slade or Chabely Norby NP with PFTs  and As needed   Please contact office for sooner follow up if symptoms do not improve or worsen or seek emergency care

## 2021-12-21 LAB — RESPIRATORY ALLERGY PROFILE REGION II ~~LOC~~
Allergen, A. alternata, m6: <0.1 kU/L
Allergen, Cedar tree, t12: <0.1 kU/L
Allergen, Comm Silver Birch, t9: <0.1 kU/L
Allergen, Cottonwood, t14: <0.1 kU/L
Allergen, D pternoyssinus,d7: <0.1 kU/L
Allergen, Mouse Urine Protein, e78: <0.1 kU/L
Allergen, Mulberry, t76: <0.1 kU/L
Allergen, Oak,t7: <0.1 kU/L
Allergen, P. notatum, m1: <0.1 kU/L
Aspergillus fumigatus, m3: <0.1 kU/L
Bermuda Grass: 0.32 kU/L — ABNORMAL HIGH
Box Elder IgE: <0.1 kU/L
CLADOSPORIUM HERBARUM (M2) IGE: <0.1 kU/L
COMMON RAGWEED (SHORT) (W1) IGE: <0.1 kU/L
Cat Dander: <0.1 kU/L
Class: 0
Class: 0
Class: 0
Class: 0
Class: 0
Class: 0
Class: 0
Class: 0
Class: 0
Class: 0
Class: 0
Class: 0
Class: 0
Class: 0
Class: 0
Class: 0
Class: 0
Class: 0
Class: 0
Class: 0
Class: 0
Class: 2
Cockroach: <0.1 kU/L
D. farinae: <0.1 kU/L
Dog Dander: <0.1 kU/L
Elm IgE: <0.1 kU/L
IgE (Immunoglobulin E), Serum: 14 kU/L (ref <=114)
Johnson Grass: 0.32 kU/L — ABNORMAL HIGH
Pecan/Hickory Tree IgE: <0.1 kU/L
Rough Pigweed  IgE: <0.1 kU/L
Sheep Sorrel IgE: <0.1 kU/L
Timothy Grass: 2.34 kU/L — ABNORMAL HIGH

## 2021-12-21 LAB — INTERPRETATION:

## 2022-01-25 ENCOUNTER — Ambulatory Visit (INDEPENDENT_AMBULATORY_CARE_PROVIDER_SITE_OTHER): Payer: No Typology Code available for payment source | Admitting: Pulmonary Disease

## 2022-01-25 DIAGNOSIS — J4541 Moderate persistent asthma with (acute) exacerbation: Secondary | ICD-10-CM

## 2022-01-25 LAB — PULMONARY FUNCTION TEST
DL/VA % pred: 95 %
DL/VA: 4.02 ml/min/mmHg/L
DLCO cor % pred: 79 %
DLCO cor: 16.23 ml/min/mmHg
DLCO unc % pred: 79 %
DLCO unc: 16.23 ml/min/mmHg
FEF 25-75 Post: 2.9 L/sec
FEF 25-75 Pre: 1.71 L/sec
FEF2575-%Change-Post: 69 %
FEF2575-%Pred-Post: 119 %
FEF2575-%Pred-Pre: 70 %
FEV1-%Change-Post: 12 %
FEV1-%Pred-Post: 77 %
FEV1-%Pred-Pre: 68 %
FEV1-Post: 2.02 L
FEV1-Pre: 1.79 L
FEV1FVC-%Change-Post: 7 %
FEV1FVC-%Pred-Pre: 103 %
FEV6-%Change-Post: 5 %
FEV6-%Pred-Post: 71 %
FEV6-%Pred-Pre: 67 %
FEV6-Post: 2.32 L
FEV6-Pre: 2.2 L
FEV6FVC-%Change-Post: 0 %
FEV6FVC-%Pred-Post: 103 %
FEV6FVC-%Pred-Pre: 103 %
FVC-%Change-Post: 5 %
FVC-%Pred-Post: 68 %
FVC-%Pred-Pre: 65 %
FVC-Post: 2.32 L
FVC-Pre: 2.21 L
Post FEV1/FVC ratio: 87 %
Post FEV6/FVC ratio: 100 %
Pre FEV1/FVC ratio: 81 %
Pre FEV6/FVC Ratio: 100 %
RV % pred: 115 %
RV: 2.25 L
TLC % pred: 91 %
TLC: 4.61 L

## 2022-01-25 NOTE — Progress Notes (Signed)
PFT done today. 

## 2022-01-26 ENCOUNTER — Ambulatory Visit: Payer: Medicare Other | Admitting: Adult Health

## 2022-01-29 ENCOUNTER — Ambulatory Visit (INDEPENDENT_AMBULATORY_CARE_PROVIDER_SITE_OTHER): Payer: No Typology Code available for payment source | Admitting: Adult Health

## 2022-01-29 ENCOUNTER — Encounter: Payer: Self-pay | Admitting: Adult Health

## 2022-01-29 VITALS — BP 120/70 | HR 69 | Ht 65.0 in | Wt 173.6 lb

## 2022-01-29 DIAGNOSIS — J4541 Moderate persistent asthma with (acute) exacerbation: Secondary | ICD-10-CM | POA: Diagnosis not present

## 2022-01-29 DIAGNOSIS — R053 Chronic cough: Secondary | ICD-10-CM

## 2022-01-29 DIAGNOSIS — J4540 Moderate persistent asthma, uncomplicated: Secondary | ICD-10-CM

## 2022-01-29 DIAGNOSIS — J45909 Unspecified asthma, uncomplicated: Secondary | ICD-10-CM | POA: Insufficient documentation

## 2022-01-29 LAB — POCT EXHALED NITRIC OXIDE: FeNO level (ppb): 82

## 2022-01-29 MED ORDER — MOMETASONE FURO-FORMOTEROL FUM 200-5 MCG/ACT IN AERO: 2.0000 | INHALATION_SPRAY | Freq: Two times a day (BID) | RESPIRATORY_TRACT | 5 refills | Status: AC

## 2022-01-29 NOTE — Progress Notes (Signed)
$'@Patient'v$  ID: Carmen Perkins, female    DOB: 12-Sep-1965, 56 y.o.   MRN: [id number]  Chief Complaint  Patient presents with   Follow-up    Referring provider: [facility]*  HPI: 55 year old female never smoker seen for pulmonary consult November 13, 2021 for chronic cough x3 months consistent with asthma Retired from the [employer].  Gets her care through the [place] system in [place] Has a service dog he  TEST/EVENTS :  Eosinophils 200 CT chest October 29, 2021 showed severe bronchial wall thickening with multi focal areas of mucous plugging.   Final fungal cultures November 16, 2021 showed no fungal elements seen, possible contaminant no additional fungi isolated,  unidentified mold not resembling any of the known human pathogens no further work-up  IGE 14, allergy panel positive for grasses  01/29/2022 Follow up ; Asthma , Chronic cough  Patient presents for a 6-week follow-up.  Patient was seen in June for a pulmonary consult for cough over the last 3 months.  This occurred after cleaning out her mother's house and was felt to be exposed to some mold.  Chest x-ray was clear.  She was treated with a 10-day course of clarithromycin for suspected asthmatic bronchitis.  Sputum cultures were negative.  Final fungal cultures were negative.  Allergy panel was positive for grasses.  IgE was 14.  Eosinophil absolute count was 200.  CT chest in June showed severe bronchial wall thickening and areas of plugged mucus.  Pulmonary function test on 911 showed moderate restriction with an FEV1 at 77%, ratio 87, FVC 68%, positive bronchodilator response, DLCO 79%.  We reviewed her PFTs in detail.   Last visit patient was recommended to begin [facility] twice daily.  Added cough control regimen with Delsym and Tessalon.  And was given prednisone 20 mg for 5 days.  She was continued on neb treatments with hypertonic nebs daily and DuoNeb as needed.   Started on chronic rhinitis treatment with Zyrtec and  Chlor-[place]eton.  And Pepcid at bedtime Since last visit she is feeling some better with little less wheezing and cough.  Feels that since she came here she is about 80% better but continues to have this ongoing aggravating cough and postnasal drainage Did not start [facility] as [facility].  Not taking tessalon or chlortrimeton.  Exhaled nitric oxide testing today is 82ppb .      Allergies  Allergen Reactions   Atenolol    Lisinopril     Immunization History  Administered Date(s) Administered   H1N1 09/05/2008   Influenza Inj Mdck Quad With Preservative 03/21/2019   Influenza Split 02/15/2015, 05/18/2015, 03/30/2018   Influenza, Seasonal, Injecte, Preservative Fre 05/22/2009, 02/02/2010   Influenza,inj,Quad PF,6+ Mos 03/15/2017, 02/13/2021   Influenza-Unspecified 04/20/2007, 05/27/2008, 04/27/2012, 08/20/2013, 02/14/2014, 04/01/2015, 02/15/2019, 01/16/2020   Moderna Sars-Covid-2 Vaccination 07/12/2019, 08/09/2019   Pneumococcal Conjugate-13 03/21/2019   Pneumococcal Polysaccharide-23 04/29/2010   Tdap 04/29/2010, 03/15/2017    Past Medical History:  Diagnosis Date   Anxiety    Bronchitis    Depression    Diabetes ([place])    Environmental allergies    Environmental allergies    GERD (gastroesophageal reflux disease)    Glaucoma    Hypertension    PTSD (post-traumatic stress disorder)     Tobacco History: Social History   Tobacco Use  Smoking Status Never  Smokeless Tobacco Never   Counseling given: Not Answered   Outpatient Medications Prior to Visit  Medication Sig Dispense Refill   albuterol (  2.5 MG/3ML) 0.083% NEBU 3 mL, albuterol (5 MG/ML) 0.5% NEBU 0.5 mL Inhale into the lungs.     albuterol (PROVENTIL HFA;VENTOLIN HFA) 108 (90 BASE) MCG/ACT inhaler Inhale 2 puffs into the lungs every 4 (four) hours as needed. For wheezing or shortness of breath.     amLODipine (NORVASC) 10 MG tablet Take 10 mg by mouth daily.     benzonatate (TESSALON) 100 MG capsule Take  100 mg by mouth 3 (three) times daily.     bisacodyl (BISACODYL) 5 MG EC tablet Take 1 tablet (5 mg total) by mouth once. 30 tablet 0   Calcium Carbonate-Vitamin D (CALCIUM 600 + D PO) Take 1 tablet by mouth 2 (two) times daily.     cefdinir (OMNICEF) 300 MG capsule Take 1 capsule (300 mg total) by mouth 2 (two) times daily. 14 capsule 0   cetirizine (ZYRTEC) 10 MG tablet Take 10 mg by mouth daily.     clarithromycin (BIAXIN) 500 MG tablet Take 1 tablet (500 mg total) by mouth 2 (two) times daily. 20 tablet 0   diclofenac (VOLTAREN) 75 MG EC tablet Take 75 mg by mouth 2 (two) times daily as needed.     hydrochlorothiazide (HYDRODIURIL) 25 MG tablet Take 25 mg by mouth daily.     hydrOXYzine (ATARAX/VISTARIL) 10 MG tablet Take 50 mg by mouth 2 (two) times daily as needed.     ibuprofen (ADVIL,MOTRIN) 800 MG tablet Take 800 mg by mouth every 8 (eight) hours as needed. For back pain.     losartan (COZAAR) 100 MG tablet Take 100 mg by mouth daily.  5   metFORMIN (GLUCOPHAGE-XR) 500 MG 24 hr tablet Take 500 mg by mouth daily with breakfast.     Multiple Vitamin (MULTIVITAMIN WITH MINERALS) TABS tablet Take 1 tablet by mouth daily.     polyvinyl alcohol (LIQUIFILM TEARS) 1.4 % ophthalmic solution Place 1 drop into both eyes as needed. For dry eyes.     potassium chloride SA (K-DUR,KLOR-CON) 20 MEQ tablet Take 20 mEq by mouth 2 (two) times daily.     predniSONE (DELTASONE) 20 MG tablet Take 1 tablet (20 mg total) by mouth daily with breakfast. 5 tablet 0   sennosides-docusate sodium (SENOKOT-S) 8.6-50 MG tablet Take 2 tablets by mouth 2 (two) times daily.     sodium chloride HYPERTONIC 3 % nebulizer solution Take by nebulization as needed for other. 750 mL 12   vitamin C (ASCORBIC ACID) 250 MG tablet Take 500 mg by mouth daily.     mometasone-formoterol (DULERA) 200-5 MCG/ACT AERO Inhale 2 puffs into the lungs 2 (two) times daily. 1 each 5   Facility-Administered Medications Prior to Visit  Medication  Dose Route Frequency Provider Last Rate Last Admin   0.9 %  sodium chloride infusion  500 mL Intravenous Continuous Milus Banister, MD         Review of Systems:   Constitutional:   No  weight loss, night sweats,  Fevers, chills,  +fatigue, or  lassitude.  HEENT:   No headaches,  Difficulty swallowing,  Tooth/dental problems, or  Sore throat,                No sneezing, itching, ear ache,  +nasal congestion, post nasal drip,   CV:  No chest pain,  Orthopnea, PND, swelling in lower extremities, anasarca, dizziness, palpitations, syncope.   GI  No heartburn, indigestion, abdominal pain, nausea, vomiting, diarrhea, change in bowel habits, loss of appetite, bloody stools.  Resp: .  No chest wall deformity  Skin: no rash or lesions.  GU: no dysuria, change in color of urine, no urgency or frequency.  No flank pain, no hematuria   MS:  No joint pain or swelling.  No decreased range of motion.  No back pain.    Physical Exam  BP 120/70   Pulse 69   Ht '5\' 5"'$  (1.651 m)   Wt 173 lb 9.6 oz (78.7 kg)   SpO2 98%   BMI 28.89 kg/m   GEN: A/Ox3; pleasant , NAD, well nourished    HEENT:  [place]/AT,  EACs-clear, TMs-wnl, NOSE-clear, THROAT-clear, no lesions, no postnasal drip or exudate noted.   NECK:  Supple w/ fair ROM; no JVD; normal carotid impulses w/o bruits; no thyromegaly or nodules palpated; no lymphadenopathy.    RESP  Clear  P & A; w/o, wheezes/ rales/ or rhonchi. no accessory muscle use, no dullness to percussion  CARD:  RRR, no m/r/g, no peripheral edema, pulses intact, no cyanosis or clubbing.  GI:   Soft & nt; nml bowel sounds; no organomegaly or masses detected.   Musco: Warm bil, no deformities or joint swelling noted.   Neuro: alert, no focal deficits noted.    Skin: Warm, no lesions or rashes    Lab Results:  CBC    Component Value Date/Time   WBC 6.1 09/27/2021 1242   RBC 4.51 09/27/2021 1242   HGB 13.0 09/27/2021 1242   HCT 39.5 09/27/2021 1242    PLT 270 09/27/2021 1242   MCV 87.6 09/27/2021 1242   MCH 28.8 09/27/2021 1242   MCHC 32.9 09/27/2021 1242   RDW 13.7 09/27/2021 1242   LYMPHSABS 1.9 09/27/2021 1242   MONOABS 0.3 09/27/2021 1242   EOSABS 0.2 09/27/2021 1242   BASOSABS 0.0 09/27/2021 1242    BMET   ProBNP No results found for: "PROBNP"  Imaging: No results found.       Latest Ref Rng & Units 01/25/2022    4:00 PM  PFT Results  FVC-Pre L 2.21  P  FVC-Predicted Pre % 65  P  FVC-Post L 2.32  P  FVC-Predicted Post % 68  P  Pre FEV1/FVC % % 81  P  Post FEV1/FCV % % 87  P  FEV1-Pre L 1.79  P  FEV1-Predicted Pre % 68  P  FEV1-Post L 2.02  P  DLCO uncorrected ml/min/mmHg 16.23  P  DLCO UNC% % 79  P  DLCO corrected ml/min/mmHg 16.23  P  DLCO COR %Predicted % 79  P  DLVA Predicted % 95  P  TLC L 4.61  P  TLC % Predicted % 91  P  RV % Predicted % 115  P    P Preliminary result    No results found for: "NITRICOXIDE"      Assessment & Plan:   Asthma Moderate persistent asthma with an allergic phenotype.  Pulmonary function testing shows moderate restriction with reversibility consistent with asthma.  Exhaled nitric oxide testing elevated consistent with an allergic phenotype. Recommend patient use an ICS/LABA maintenance inhaler.  Prescription has been given for [facility] to take to the [place] system if this is not covered we will need to find their formulary for coverage she does not have prescription coverage except through the [facility] system Control for triggers.  Plan  Patient Instructions  Continue on Hypertonic nebs 1 times daily as needed  Continue on Duoneb twice daily as needed  Delsym 2 tsp Twice daily  for cough As needed   Use Tessalon Three times a day  for cough As needed   Stop Asmanex , Begin Dulera 200 2 puffs Twice daily  , rinse after use. ([facility] if not covered) .  Continue on Prilosec daily  Albuterol inhaler As needed   Continue on Zyrtec daily  Begin Chlorpheniramine '4mg'$  At  bedtime   Pepcid '20mg'$  At bedtime   Follow up in 2 months with Dr. Ander Slade or Woody Kronberg NP  and As needed   Please contact office for sooner follow up if symptoms do not improve or worsen or seek emergency care      Chronic cough Upper airway cough syndrome.  Patient is continue on cough control regimen control for triggers.  Plan  Patient Instructions  Continue on Hypertonic nebs 1 times daily as needed  Continue on Duoneb twice daily as needed  Delsym 2 tsp Twice daily  for cough As needed   Use Tessalon Three times a day  for cough As needed   Stop Asmanex , Begin Dulera 200 2 puffs Twice daily  , rinse after use. ([facility] by [facility] call if not covered) .  Continue on Prilosec daily  Albuterol inhaler As needed   Continue on Zyrtec daily  Begin Chlorpheniramine '4mg'$  At bedtime   Pepcid '20mg'$  At bedtime   Follow up in 2 months with Dr. Ander Slade or Malahki Gasaway NP  and As needed   Please contact office for sooner follow up if symptoms do not improve or worsen or seek emergency care        Rexene Edison, NP 01/29/2022

## 2022-01-29 NOTE — Patient Instructions (Addendum)
Continue on Hypertonic nebs 1 times daily as needed  Continue on Duoneb twice daily as needed  Delsym 2 tsp Twice daily  for cough As needed   Use Tessalon Three times a day  for cough As needed   Stop Asmanex , Begin Dulera 200 2 puffs Twice daily  , rinse after use. ([facility] by [facility] , call if not covered) .  Continue on Prilosec daily  Albuterol inhaler As needed   Continue on Zyrtec daily  Begin Chlorpheniramine '4mg'$  At bedtime   Pepcid '20mg'$  At bedtime   Follow up in 2 months with Dr. Ander Slade or Annastyn Silvey NP  and As needed   Please contact office for sooner follow up if symptoms do not improve or worsen or seek emergency care

## 2022-01-29 NOTE — Assessment & Plan Note (Signed)
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  Pulmonary function testing shows moderate restriction with reversibility consistent with asthma.  Exhaled nitric oxide testing elevated consistent with an allergic phenotype. Recommend patient use an ICS/LABA maintenance inhaler.  Prescription has been given for [facility] to take to the [place] system if this is not covered we will need to find their formulary for coverage she does not have prescription coverage except through the [facility] system Control for triggers.  Plan  Patient Instructions  Continue on Hypertonic nebs 1 times daily as needed  Continue on Duoneb twice daily as needed  Delsym 2 tsp Twice daily  for cough As needed   Use Tessalon Three times a day  for cough As needed   Stop Asmanex , Begin Dulera 200 2 puffs Twice daily  , rinse after use. ([facility] by [facility] call if not covered) .  Continue on Prilosec daily  Albuterol inhaler As needed   Continue on Zyrtec daily  Begin Chlorpheniramine '4mg'$  At bedtime   Pepcid '20mg'$  At bedtime   Follow up in 2 months with Dr. Ander Slade or Blimy Napoleon NP  and As needed   Please contact office for sooner follow up if symptoms do not improve or worsen or seek emergency care

## 2022-01-29 NOTE — Progress Notes (Signed)
poc

## 2022-01-29 NOTE — Assessment & Plan Note (Signed)
Upper airway cough syndrome.  Patient is continue on cough control regimen control for triggers.  Plan  Patient Instructions  Continue on Hypertonic nebs 1 times daily as needed  Continue on Duoneb twice daily as needed  Delsym 2 tsp Twice daily  for cough As needed   Use Tessalon Three times a day  for cough As needed   Stop Asmanex , Begin Dulera 200 2 puffs Twice daily  , rinse after use. ([facility] by [facility] , call if not covered) .  Continue on Prilosec daily  Albuterol inhaler As needed   Continue on Zyrtec daily  Begin Chlorpheniramine '4mg'$  At bedtime   Pepcid '20mg'$  At bedtime   Follow up in 2 months with Dr. Ander Slade or Parth Mccormac NP  and As needed   Please contact office for sooner follow up if symptoms do not improve or worsen or seek emergency care

## 2022-03-31 ENCOUNTER — Ambulatory Visit (INDEPENDENT_AMBULATORY_CARE_PROVIDER_SITE_OTHER): Payer: No Typology Code available for payment source | Admitting: Pulmonary Disease

## 2022-03-31 ENCOUNTER — Encounter: Payer: Self-pay | Admitting: Pulmonary Disease

## 2022-03-31 VITALS — BP 142/84 | HR 88 | Temp 98.0°F | Ht 64.0 in | Wt 181.0 lb

## 2022-03-31 DIAGNOSIS — R053 Chronic cough: Secondary | ICD-10-CM

## 2022-03-31 NOTE — Patient Instructions (Addendum)
I will see you back in about 4 weeks  We will get a CT scan of the chest without contrast to follow-up on the previous abnormality noted  Continue using Dulera  Over-the-counter interventions that we did discuss include   -N-acetylcysteine-600 mg twice a day-tried for about 3 to 4 weeks  -Flutter device-this vibrates against your airway and help knock secretions loose so that he can cough it up more effectively  If the CAT scan is abnormal then we can consider a bronchoscopy  Call us with significant concerns follow-up in 6

## 2022-03-31 NOTE — Progress Notes (Signed)
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   albuterol (PROVENTIL HFA;VENTOLIN HFA) 108 (90 BASE) MCG/ACT inhaler Inhale 2 puffs into the lungs every 4 (four) hours as needed. For wheezing or shortness of breath.   amLODipine (NORVASC) 10 MG tablet Take 10 mg by mouth daily.   benzonatate (TESSALON) 100 MG capsule Take 100 mg by mouth 3 (three) times daily.   bisacodyl (BISACODYL) 5 MG EC tablet Take 1 tablet (5 mg total) by mouth once.   Calcium Carbonate-Vitamin D (CALCIUM 600 + D PO) Take 1 tablet by mouth 2 (two) times daily.   cetirizine (ZYRTEC) 10 MG tablet Take 10 mg by mouth daily.   diclofenac (VOLTAREN) 75 MG EC tablet Take 75 mg by mouth 2 (two) times daily as needed.   hydrochlorothiazide (HYDRODIURIL) 25 MG tablet Take 25 mg by mouth daily.   hydrOXYzine (ATARAX/VISTARIL) 10 MG tablet Take 50 mg by mouth 2 (two) times daily as  needed.   ibuprofen (ADVIL,MOTRIN) 800 MG tablet Take 800 mg by mouth every 8 (eight) hours as needed. For back pain.   losartan (COZAAR) 100 MG tablet Take 100 mg by mouth daily.   metFORMIN (GLUCOPHAGE-XR) 500 MG 24 hr tablet Take 500 mg by mouth daily with breakfast.   mometasone-formoterol (DULERA) 200-5 MCG/ACT AERO Inhale 2 puffs into the lungs 2 (two) times daily.   Multiple Vitamin (MULTIVITAMIN WITH MINERALS) TABS tablet Take 1 tablet by mouth daily.   polyvinyl alcohol (LIQUIFILM TEARS) 1.4 % ophthalmic solution Place 1 drop into both eyes as needed. For dry eyes.   potassium chloride SA (K-DUR,KLOR-CON) 20 MEQ tablet Take 20 mEq by mouth 2 (two) times daily.   sennosides-docusate sodium (SENOKOT-S) 8.6-50 MG tablet Take 2 tablets by mouth 2 (two) times daily.   sodium chloride HYPERTONIC 3 % nebulizer solution Take by nebulization as needed for other.   vitamin C (ASCORBIC ACID) 250 MG tablet Take 500 mg by mouth daily.   cefdinir (OMNICEF) 300 MG capsule Take 1 capsule (300 mg total) by mouth 2 (two) times daily. (Patient not taking: Reported on 03/31/2022)   clarithromycin (BIAXIN) 500 MG tablet Take 1 tablet (500 mg total) by mouth 2 (two) times daily. (Patient not taking: Reported on 03/31/2022)   predniSONE (DELTASONE) 20 MG tablet Take 1 tablet (20 mg total) by mouth daily with breakfast. (Patient not  taking: Reported on 03/31/2022)   Facility-Administered Encounter Medications as of 03/31/2022  Medication   0.9 %  sodium chloride infusion    Allergies as of 03/31/2022 - Review Complete 03/31/2022  Allergen Reaction Noted   Atenolol  03/22/2017   Lisinopril  03/22/2017    Past Medical History:  Diagnosis Date   Anxiety    Bronchitis    Depression    Diabetes ([place])    Environmental allergies    Environmental allergies    GERD (gastroesophageal reflux disease)    Glaucoma    Hypertension    PTSD (post-traumatic stress disorder)     Past Surgical History:   Procedure Laterality Date   ABDOMINAL HYSTERECTOMY      Family History  Problem Relation Age of Onset   Colon cancer Neg Hx     Social History   Socioeconomic History   Marital status: Divorced    Spouse name: Not on file   Number of children: Not on file   Years of education: Not on file   Highest education level: Not on file  Occupational History   Not on file  Tobacco Use   Smoking status: Never   Smokeless tobacco: Never  Substance and Sexual Activity   Alcohol use: Not Currently    Comment: occasional   Drug use: No   Sexual activity: Not on file  Other Topics Concern   Not on file  Social History Narrative   Not on file   Social Determinants of Health   Financial Resource Strain: Not on file  Food Insecurity: Not on file  Transportation Needs: Not on file  Physical Activity: Not on file  Stress: Not on file  Social Connections: Not on file  Intimate Partner Violence: Not on file    Review of Systems  Respiratory:  Positive for cough and shortness of breath.     Vitals:   03/31/22 0949  BP: (!) 142/84  Pulse: 88  Temp: 98 F (36.7 C)  SpO2: 100%     Physical Exam Constitutional:      Appearance: Normal appearance.  HENT:     Head: Normocephalic.     Mouth/Throat:     Mouth: Mucous membranes are moist.  Cardiovascular:     Rate and Rhythm: Normal rate and regular rhythm.     Heart sounds: No murmur heard.    No friction rub.  Pulmonary:     Effort: No respiratory distress.     Breath sounds: No stridor. Rhonchi present. No wheezing.  Musculoskeletal:     Cervical back: No rigidity or tenderness.  Neurological:     Mental Status: She is alert.  Psychiatric:        Mood and Affect: Mood normal.     Data Reviewed: No significant records on care everywhere regarding recent exacerbation  Records from the [place] did indicate that she was evaluated for the bronchitis She is on nebulization treatments-records reviewed  CT scan report  noted for evidence of bronchitis, mucous plugging-October 29, 2021  Recent pulmonary function tests -Reveals moderate obstructive disease, mild restriction with mild reduction in diffusing capacity  Assessment:  Chronic bronchitis  History of asthma  Persistent bronchitis  Abnormal CT scan of the chest-mucous plugging  Abnormal PFT  Chronic cough  Plan/Recommendations: Continue Dulera  Still with cough and congestion  Did not tolerate hypertonic saline  Flutter device use may help  Trial with N-acetylcysteine  Repeat CT scan of the chest to look for mucous plugging  Possible bronchoscopy  Follow-up in 4 to 6 weeks   Sherrilyn Rist MD [facility] 03/31/2022, 9:51 AM  CC: [facility]

## 2022-04-07 ENCOUNTER — Ambulatory Visit (HOSPITAL_COMMUNITY)
Admission: RE | Admit: 2022-04-07 | Discharge: 2022-04-07 | Disposition: A | Discharge status: Home / Self Care | Payer: No Typology Code available for payment source | Source: Ambulatory Visit | Attending: Pulmonary Disease | Admitting: Pulmonary Disease

## 2022-04-07 DIAGNOSIS — R053 Chronic cough: Secondary | ICD-10-CM | POA: Diagnosis present

## 2022-05-12 ENCOUNTER — Encounter: Payer: Self-pay | Admitting: Pulmonary Disease

## 2022-05-12 ENCOUNTER — Ambulatory Visit (INDEPENDENT_AMBULATORY_CARE_PROVIDER_SITE_OTHER): Payer: No Typology Code available for payment source | Admitting: Pulmonary Disease

## 2022-05-12 VITALS — BP 112/74 | HR 94 | Ht 64.0 in | Wt 197.2 lb

## 2022-05-12 DIAGNOSIS — J4540 Moderate persistent asthma, uncomplicated: Secondary | ICD-10-CM | POA: Diagnosis not present

## 2022-05-12 DIAGNOSIS — R053 Chronic cough: Secondary | ICD-10-CM | POA: Diagnosis not present

## 2022-05-12 NOTE — Progress Notes (Signed)
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   albuterol (PROVENTIL HFA;VENTOLIN HFA) 108 (90 BASE) MCG/ACT inhaler Inhale 2 puffs into the lungs every 4 (four) hours as needed. For wheezing or shortness of breath.   amLODipine (NORVASC) 10 MG tablet Take 10 mg by mouth daily.   bisacodyl (BISACODYL) 5 MG EC tablet Take 1 tablet (5 mg total) by mouth once.   Calcium Carbonate-Vitamin D (CALCIUM 600 + D PO) Take 1 tablet by mouth 2 (two) times daily.   cefdinir (OMNICEF) 300 MG capsule Take 1 capsule (300 mg total) by mouth 2 (two) times daily.   cetirizine (ZYRTEC) 10 MG tablet Take 10 mg by mouth daily.   clarithromycin (BIAXIN) 500 MG tablet Take 1 tablet (500 mg total) by mouth 2 (two) times daily.   diclofenac (VOLTAREN) 75 MG EC tablet Take 75 mg by mouth 2 (two) times daily as needed.   hydrochlorothiazide (HYDRODIURIL) 25 MG tablet Take 25 mg by mouth daily.   hydrOXYzine  (ATARAX/VISTARIL) 10 MG tablet Take 50 mg by mouth 2 (two) times daily as needed.   ibuprofen (ADVIL,MOTRIN) 800 MG tablet Take 800 mg by mouth every 8 (eight) hours as needed. For back pain.   losartan (COZAAR) 100 MG tablet Take 100 mg by mouth daily.   metFORMIN (GLUCOPHAGE-XR) 500 MG 24 hr tablet Take 500 mg by mouth daily with breakfast.   mometasone-formoterol (DULERA) 200-5 MCG/ACT AERO Inhale 2 puffs into the lungs 2 (two) times daily.   Multiple Vitamin (MULTIVITAMIN WITH MINERALS) TABS tablet Take 1 tablet by mouth daily.   polyvinyl alcohol (LIQUIFILM TEARS) 1.4 % ophthalmic solution Place 1 drop into both eyes as needed. For dry eyes.   potassium chloride SA (K-DUR,KLOR-CON) 20 MEQ tablet Take 20 mEq by mouth 2 (two) times daily.   predniSONE (DELTASONE) 20 MG tablet Take 1 tablet (20 mg total) by mouth daily with breakfast.   sennosides-docusate sodium (SENOKOT-S) 8.6-50 MG tablet Take 2 tablets by mouth 2 (two) times daily.   sodium chloride HYPERTONIC 3 % nebulizer solution Take by nebulization as needed for other.   vitamin C (ASCORBIC ACID) 250 MG tablet Take 500 mg by mouth daily.   benzonatate (TESSALON) 100 MG capsule Take 100 mg by mouth 3 (three) times daily. (Patient not taking: Reported on 05/12/2022)   Facility-Administered Encounter Medications as of 05/12/2022  Medication   0.9 %  sodium chloride infusion    Allergies as of 05/12/2022 - Review Complete 05/12/2022  Allergen Reaction Noted   Atenolol  03/22/2017   Lisinopril  03/22/2017    Past Medical History:  Diagnosis Date   Anxiety    Bronchitis    Depression    Diabetes ([place])    Environmental allergies    Environmental allergies    GERD (gastroesophageal reflux disease)    Glaucoma    Hypertension    PTSD (post-traumatic stress disorder)     Past Surgical History:  Procedure Laterality Date   ABDOMINAL HYSTERECTOMY      Family History  Problem Relation Age of Onset   Colon cancer Neg Hx      Social History   Socioeconomic History   Marital status: Divorced    Spouse name: Not on file   Number of children: Not on file   Years of education: Not on file   Highest education level: Not on file  Occupational History   Not on file  Tobacco Use   Smoking status: Never   Smokeless tobacco: Never  Substance and Sexual Activity   Alcohol use: Not Currently    Comment: occasional   Drug use: No   Sexual activity: Not on file  Other Topics Concern   Not on file  Social History Narrative   Not on file   Social Determinants of Health   Financial Resource Strain: Not on file  Food Insecurity: Not on file  Transportation Needs: Not on file  Physical Activity: Not on file  Stress: Not on file  Social Connections: Not on file  Intimate Partner Violence: Not on file    Review of Systems  Constitutional:  Negative for fatigue.  Respiratory:  Negative for cough and shortness of breath.     Vitals:   05/12/22 0942  BP: 112/74  Pulse: 94  SpO2: 99%     Physical Exam Constitutional:      Appearance: Normal appearance.  HENT:     Head: Normocephalic.     Mouth/Throat:     Mouth: Mucous membranes are moist.  Cardiovascular:     Rate and Rhythm: Normal rate and regular rhythm.     Heart sounds: No murmur heard.    No friction rub.  Pulmonary:     Effort: No respiratory distress.     Breath sounds: No stridor. No wheezing or rhonchi.  Musculoskeletal:     Cervical back: No rigidity or tenderness.  Neurological:     Mental Status: She is alert.  Psychiatric:        Mood and Affect: Mood normal.      Data Reviewed: CT scan of the chest reviewed with the patient showing no significant abnormality  Previous CT showing mucous plugging also reviewed  Assessment:  Chronic bronchitis -Symptoms have improved  History of asthma -Has been off Dulera and symptoms have remained stable  Abnormal CT scan of the chest -Resolution of findings on repeat  CT  Abnormal PFT-with significant bronchodilator response   Chronic cough -Symptoms have resolved  Plan/Recommendations: Breztri off Dulera  Albuterol as needed  No need for invasive workup at present  Will follow-up in a year  No clear need for repeat CT scan at present   Sherrilyn Rist MD [facility] 05/12/2022, 9:52 AM  CC: [facility]

## 2022-05-12 NOTE — Patient Instructions (Signed)
Glad you are feeling a lot better  You can stay off the [place] do not have to use a flutter device  We do not need any invasive testing  You do not require a repeat CT scan of the chest  Stay active  I will see you back in about a year, call with significant concerns

## 2022-06-04 ENCOUNTER — Other Ambulatory Visit: Payer: Self-pay | Admitting: Pulmonary Disease

## 2022-06-04 ENCOUNTER — Other Ambulatory Visit: Payer: Self-pay | Admitting: Internal Medicine

## 2022-06-04 DIAGNOSIS — Z1231 Encounter for screening mammogram for malignant neoplasm of breast: Secondary | ICD-10-CM

## 2022-07-23 ENCOUNTER — Ambulatory Visit
Admission: RE | Admit: 2022-07-23 | Discharge: 2022-07-23 | Disposition: A | Discharge status: Home / Self Care | Payer: Medicare PPO | Source: Ambulatory Visit | Attending: Internal Medicine | Admitting: Internal Medicine

## 2022-07-23 DIAGNOSIS — Z1231 Encounter for screening mammogram for malignant neoplasm of breast: Secondary | ICD-10-CM

## 2022-09-13 ENCOUNTER — Telehealth: Payer: Self-pay | Admitting: Pulmonary Disease

## 2022-09-13 NOTE — Telephone Encounter (Signed)
PT calling stating she is having a bad flare up and the neb is not working even though she has used it 3 times a day. Pls call @ [phone]  Pharm: [facility].

## 2022-09-13 NOTE — Telephone Encounter (Signed)
Ret missed call.

## 2022-09-13 NOTE — Telephone Encounter (Signed)
Called the pt and there was no answer- LMTCB    

## 2022-09-13 NOTE — Telephone Encounter (Signed)
Calling again. Her phone volume was down and that is why she missed calls. Please try again.

## 2022-09-13 NOTE — Telephone Encounter (Signed)
Returning missed call

## 2022-09-13 NOTE — Telephone Encounter (Signed)
ATC, no answer.  LVM to return call. ?

## 2022-09-14 ENCOUNTER — Other Ambulatory Visit: Payer: Self-pay | Admitting: Pulmonary Disease

## 2022-09-14 MED ORDER — AZITHROMYCIN 250 MG PO TABS: ORAL_TABLET | ORAL | 0 refills | Status: DC

## 2022-09-14 MED ORDER — PREDNISONE 20 MG PO TABS: 20.0000 mg | ORAL_TABLET | Freq: Every day | ORAL | 0 refills | Status: DC

## 2022-09-14 NOTE — Telephone Encounter (Signed)
PT Calling again. Pls call.

## 2022-09-14 NOTE — Progress Notes (Signed)
Prescription for azithromycin and prednisone sent in to pharmacy 

## 2022-09-14 NOTE — Telephone Encounter (Signed)
Per Dr Wynona Neat- rxs for pred and zpack have been sent to pharm  Needs appt if not improving   I called and spoke with the pt and notified of response per Dr Wynona Neat. She verbalized understanding. Nothing further needed.

## 2022-09-14 NOTE — Telephone Encounter (Signed)
Called and spoke with the pt  She is c/o increased cough, wheezing and increased SOB x 2 wks  Cough is prod with yellow sputum  She denies fevers, aches She is using Asmanex inhaler daily and albuterol neb 4 x daily without relief  She feels a round of pred and abx would help  No appts open this wk Please advise, thanks!  Allergies  Allergen Reactions   Atenolol    Lisinopril

## 2023-06-15 ENCOUNTER — Ambulatory Visit (INDEPENDENT_AMBULATORY_CARE_PROVIDER_SITE_OTHER): Payer: No Typology Code available for payment source | Admitting: Pulmonary Disease

## 2023-06-15 ENCOUNTER — Encounter: Payer: Self-pay | Admitting: Pulmonary Disease

## 2023-06-15 VITALS — BP 118/70 | HR 85 | Temp 97.8°F | Ht 64.0 in | Wt 174.8 lb

## 2023-06-15 DIAGNOSIS — J42 Unspecified chronic bronchitis: Secondary | ICD-10-CM | POA: Diagnosis not present

## 2023-06-15 DIAGNOSIS — R0602 Shortness of breath: Secondary | ICD-10-CM

## 2023-06-15 NOTE — Patient Instructions (Signed)
Continue using albuterol as needed  I will see you a year from now  Call us with significant concerns  Make sure you stay active

## 2023-06-15 NOTE — Progress Notes (Signed)
Carmen Perkins    [id number]    Sep 23, 1965  Primary Care Physician:Stowe, Devonne Doughty, NP  Referring Physician: Hillery Aldo, NP [address] 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   albuterol (PROVENTIL HFA;VENTOLIN HFA) 108 (90 BASE) MCG/ACT inhaler Inhale 2 puffs into the lungs every 4 (four) hours as needed. For wheezing or shortness of breath.   amLODipine (NORVASC) 10 MG tablet Take 10 mg by mouth daily.   benzonatate (TESSALON) 100 MG capsule Take 100 mg by mouth 3 (three) times daily.   bisacodyl (BISACODYL) 5 MG EC tablet Take 1 tablet (5 mg total) by mouth once.   Calcium Carbonate-Vitamin D (CALCIUM 600 + D PO) Take 1 tablet by mouth 2 (two) times daily.   cetirizine (ZYRTEC) 10 MG tablet Take 10 mg by mouth daily.   diclofenac (VOLTAREN) 75 MG EC tablet Take 75 mg by mouth 2 (two) times daily as needed.   hydrochlorothiazide (HYDRODIURIL) 25 MG tablet Take 25 mg by mouth daily.   hydrOXYzine (ATARAX/VISTARIL) 10 MG tablet Take 50 mg by mouth 2 (two) times daily as needed.   ibuprofen (ADVIL,MOTRIN)  800 MG tablet Take 800 mg by mouth every 8 (eight) hours as needed. For back pain.   losartan (COZAAR) 100 MG tablet Take 100 mg by mouth daily.   metFORMIN (GLUCOPHAGE-XR) 500 MG 24 hr tablet Take 500 mg by mouth daily with breakfast.   mometasone-formoterol (DULERA) 200-5 MCG/ACT AERO Inhale 2 puffs into the lungs 2 (two) times daily.   Multiple Vitamin (MULTIVITAMIN WITH MINERALS) TABS tablet Take 1 tablet by mouth daily.   polyvinyl alcohol (LIQUIFILM TEARS) 1.4 % ophthalmic solution Place 1 drop into both eyes as needed. For dry eyes.   potassium chloride SA (K-DUR,KLOR-CON) 20 MEQ tablet Take 20 mEq by mouth 2 (two) times daily.   sennosides-docusate sodium (SENOKOT-S) 8.6-50 MG tablet Take 2 tablets by mouth 2 (two) times daily.   sodium chloride HYPERTONIC 3 % nebulizer solution Take by nebulization as needed for other.   vitamin C (ASCORBIC ACID) 250 MG tablet Take 500 mg by mouth daily.   [DISCONTINUED] azithromycin (ZITHROMAX Z-PAK) 250 MG tablet Take 2 tablets day 1 and then 1 daily for 4 days (Patient not taking: Reported on 06/15/2023)   [DISCONTINUED] predniSONE (DELTASONE) 20 MG tablet Take 1 tablet (20 mg total) by mouth daily with breakfast. (Patient not taking: Reported on 06/15/2023)   Facility-Administered Encounter Medications as of 06/15/2023  Medication   0.9 %  sodium chloride infusion    Allergies as of  06/15/2023 - Review Complete 06/15/2023  Allergen Reaction Noted   Atenolol  03/22/2017   Lisinopril  03/22/2017    Past Medical History:  Diagnosis Date   Anxiety    Bronchitis    Depression    Diabetes ([facility])    Environmental allergies    Environmental allergies    GERD (gastroesophageal reflux disease)    Glaucoma    Hypertension    PTSD (post-traumatic stress disorder)     Past Surgical History:  Procedure Laterality Date   ABDOMINAL HYSTERECTOMY      Family History  Problem Relation Age of Onset   Colon cancer Neg Hx     Social History    Socioeconomic History   Marital status: Divorced    Spouse name: Not on file   Number of children: Not on file   Years of education: Not on file   Highest education level: Not on file  Occupational History   Not on file  Tobacco Use   Smoking status: Never   Smokeless tobacco: Never  Substance and Sexual Activity   Alcohol use: Not Currently    Comment: occasional   Drug use: No   Sexual activity: Not on file  Other Topics Concern   Not on file  Social History Narrative   Not on file   Social Drivers of Health   Financial Resource Strain: Not on file  Food Insecurity: Not on file  Transportation Needs: Not on file  Physical Activity: Not on file  Stress: Not on file  Social Connections: Not on file  Intimate Partner Violence: Not on file    Review of Systems  Constitutional:  Negative for fatigue.  Respiratory:  Negative for cough and shortness of breath.     Vitals:   06/15/23 0956  BP: 118/70  Pulse: 85  Temp: 97.8 F (36.6 C)  SpO2: 97%     Physical Exam Constitutional:      Appearance: Normal appearance.  HENT:     Head: Normocephalic.     Mouth/Throat:     Mouth: Mucous membranes are moist.  Cardiovascular:     Rate and Rhythm: Normal rate and regular rhythm.     Heart sounds: No murmur heard.    No friction rub.  Pulmonary:     Effort: No respiratory distress.     Breath sounds: No stridor. No wheezing or rhonchi.  Musculoskeletal:     Cervical back: No rigidity or tenderness.  Neurological:     Mental Status: She is alert.  Psychiatric:        Mood and Affect: Mood normal.      Data Reviewed: CT scan of the chest reviewed with the patient showing no significant abnormality  Previous CT showing mucous plugging also reviewed  Assessment:  Chronic bronchitis -Symptoms have improved  History of asthma -Has been off maintenance inhalers -Rare use of albuterol  Last CT scan was normal with resolution of previous  findings  Abnormal PFT-with significant bronchodilator response   Chronic cough -Resolved  Plan/Recommendations: Use albuterol as needed  No need for any further workup at the present time  Appears to be doing well  Tentative follow-up a year from now   Virl Diamond MD  Pulmonary and Critical Care 06/15/2023, 10:17 AM  CC: Hillery Aldo, NP

## 2023-08-10 ENCOUNTER — Other Ambulatory Visit: Payer: Self-pay | Admitting: Student

## 2023-08-10 DIAGNOSIS — Z0000 Encounter for general adult medical examination without abnormal findings: Secondary | ICD-10-CM

## 2023-08-12 ENCOUNTER — Ambulatory Visit

## 2023-09-06 ENCOUNTER — Ambulatory Visit
Admission: RE | Admit: 2023-09-06 | Discharge: 2023-09-06 | Discharge status: Home / Self Care | Source: Ambulatory Visit | Attending: Student

## 2023-09-06 DIAGNOSIS — Z0000 Encounter for general adult medical examination without abnormal findings: Secondary | ICD-10-CM
